# Patient Record
Sex: Male | Born: 1950 | Race: Black or African American | Hispanic: No | Smoking: Current some day smoker
Health system: Southern US, Community
[De-identification: ages and names within clinical notes are randomized; demographics above are authoritative.]

## PROBLEM LIST (undated history)

## (undated) DIAGNOSIS — I639 Cerebral infarction, unspecified: Secondary | ICD-10-CM

## (undated) DIAGNOSIS — K219 Gastro-esophageal reflux disease without esophagitis: Secondary | ICD-10-CM

## (undated) DIAGNOSIS — D179 Benign lipomatous neoplasm, unspecified: Secondary | ICD-10-CM

## (undated) DIAGNOSIS — B182 Chronic viral hepatitis C: Secondary | ICD-10-CM

## (undated) DIAGNOSIS — G4733 Obstructive sleep apnea (adult) (pediatric): Secondary | ICD-10-CM

## (undated) DIAGNOSIS — I10 Essential (primary) hypertension: Secondary | ICD-10-CM

## (undated) DIAGNOSIS — Z973 Presence of spectacles and contact lenses: Secondary | ICD-10-CM

## (undated) HISTORY — DX: Cerebral infarction, unspecified: I63.9

## (undated) HISTORY — DX: Benign lipomatous neoplasm, unspecified: D17.9

## (undated) HISTORY — DX: Gastro-esophageal reflux disease without esophagitis: K21.9

## (undated) HISTORY — PX: FINGER SURGERY: SHX640

## (undated) HISTORY — DX: Obstructive sleep apnea (adult) (pediatric): G47.33

## (undated) HISTORY — DX: Presence of spectacles and contact lenses: Z97.3

## (undated) HISTORY — DX: Chronic viral hepatitis C: B18.2

## (undated) HISTORY — PX: HUMERUS FRACTURE SURGERY: SHX670

---

## 2003-02-26 ENCOUNTER — Emergency Department (HOSPITAL_COMMUNITY): Admission: EM | Admit: 2003-02-26 | Discharge: 2003-02-26 | Payer: Self-pay | Admitting: Emergency Medicine

## 2003-09-20 ENCOUNTER — Emergency Department (HOSPITAL_COMMUNITY): Admission: EM | Admit: 2003-09-20 | Discharge: 2003-09-20 | Payer: Self-pay | Admitting: Emergency Medicine

## 2005-01-16 ENCOUNTER — Emergency Department (HOSPITAL_COMMUNITY): Admission: EM | Admit: 2005-01-16 | Discharge: 2005-01-16 | Payer: Self-pay | Admitting: Family Medicine

## 2005-03-01 ENCOUNTER — Ambulatory Visit: Payer: Self-pay | Admitting: Internal Medicine

## 2005-03-10 ENCOUNTER — Ambulatory Visit: Payer: Self-pay | Admitting: Internal Medicine

## 2005-03-22 ENCOUNTER — Ambulatory Visit: Payer: Self-pay | Admitting: *Deleted

## 2005-03-30 ENCOUNTER — Ambulatory Visit: Payer: Self-pay | Admitting: Internal Medicine

## 2005-08-14 ENCOUNTER — Emergency Department (HOSPITAL_COMMUNITY): Admission: EM | Admit: 2005-08-14 | Discharge: 2005-08-14 | Payer: Self-pay | Admitting: Family Medicine

## 2006-01-03 ENCOUNTER — Emergency Department (HOSPITAL_COMMUNITY): Admission: EM | Admit: 2006-01-03 | Discharge: 2006-01-03 | Payer: Self-pay | Admitting: Family Medicine

## 2006-01-09 ENCOUNTER — Ambulatory Visit: Payer: Self-pay | Admitting: Internal Medicine

## 2006-02-20 ENCOUNTER — Ambulatory Visit: Payer: Self-pay | Admitting: Internal Medicine

## 2006-11-28 ENCOUNTER — Ambulatory Visit: Payer: Self-pay | Admitting: Internal Medicine

## 2006-11-28 ENCOUNTER — Encounter (INDEPENDENT_AMBULATORY_CARE_PROVIDER_SITE_OTHER): Payer: Self-pay | Admitting: Nurse Practitioner

## 2006-11-28 LAB — CONVERTED CEMR LAB
AST: 51 units/L — ABNORMAL HIGH (ref 0–37)
Alkaline Phosphatase: 53 units/L (ref 39–117)
BUN: 12 mg/dL (ref 6–23)
Basophils Relative: 1 % (ref 0–1)
Creatinine, Ser: 1.06 mg/dL (ref 0.40–1.50)
Eosinophils Absolute: 0.1 10*3/uL (ref 0.0–0.7)
Hemoglobin: 14.6 g/dL (ref 13.0–17.0)
MCHC: 33.3 g/dL (ref 30.0–36.0)
MCV: 95 fL (ref 78.0–100.0)
Monocytes Absolute: 0.6 10*3/uL (ref 0.2–0.7)
Monocytes Relative: 11 % (ref 3–11)
RBC: 4.62 M/uL (ref 4.22–5.81)

## 2006-11-29 ENCOUNTER — Encounter (INDEPENDENT_AMBULATORY_CARE_PROVIDER_SITE_OTHER): Payer: Self-pay | Admitting: Nurse Practitioner

## 2006-11-29 LAB — CONVERTED CEMR LAB
HCV Ab: POSITIVE — AB
Hep A Total Ab: POSITIVE — AB
Hep B Core Total Ab: POSITIVE — AB

## 2006-12-05 ENCOUNTER — Ambulatory Visit: Payer: Self-pay | Admitting: Internal Medicine

## 2006-12-19 ENCOUNTER — Encounter (INDEPENDENT_AMBULATORY_CARE_PROVIDER_SITE_OTHER): Payer: Self-pay | Admitting: Nurse Practitioner

## 2006-12-19 ENCOUNTER — Ambulatory Visit: Payer: Self-pay | Admitting: Internal Medicine

## 2006-12-19 LAB — CONVERTED CEMR LAB
HDL: 41 mg/dL (ref >39)
LDL Cholesterol: 52 mg/dL (ref 0–99)

## 2007-01-17 ENCOUNTER — Encounter (INDEPENDENT_AMBULATORY_CARE_PROVIDER_SITE_OTHER): Payer: Self-pay | Admitting: *Deleted

## 2007-02-05 DIAGNOSIS — B1710 Acute hepatitis C without hepatic coma: Secondary | ICD-10-CM | POA: Insufficient documentation

## 2007-02-05 DIAGNOSIS — Z8619 Personal history of other infectious and parasitic diseases: Secondary | ICD-10-CM

## 2007-02-05 DIAGNOSIS — I10 Essential (primary) hypertension: Secondary | ICD-10-CM | POA: Insufficient documentation

## 2007-09-07 ENCOUNTER — Ambulatory Visit: Payer: Self-pay | Admitting: Internal Medicine

## 2007-09-07 LAB — CONVERTED CEMR LAB
CO2: 20 meq/L (ref 19–32)
Calcium: 8.9 mg/dL (ref 8.4–10.5)
Chloride: 107 meq/L (ref 96–112)
Direct LDL: 57 mg/dL
HDL: 37 mg/dL — ABNORMAL LOW (ref >39)
Sodium: 141 meq/L (ref 135–145)
Total CHOL/HDL Ratio: 2.9
Triglycerides: 133 mg/dL (ref <150)

## 2007-09-27 ENCOUNTER — Ambulatory Visit: Payer: Self-pay | Admitting: Internal Medicine

## 2007-10-08 ENCOUNTER — Emergency Department (HOSPITAL_COMMUNITY): Admission: EM | Admit: 2007-10-08 | Discharge: 2007-10-08 | Payer: Self-pay | Admitting: Family Medicine

## 2008-03-26 ENCOUNTER — Ambulatory Visit: Payer: Self-pay | Admitting: Internal Medicine

## 2008-06-04 ENCOUNTER — Ambulatory Visit: Payer: Self-pay | Admitting: Internal Medicine

## 2008-07-16 ENCOUNTER — Ambulatory Visit: Payer: Self-pay | Admitting: Internal Medicine

## 2008-10-04 ENCOUNTER — Emergency Department (HOSPITAL_COMMUNITY): Admission: EM | Admit: 2008-10-04 | Discharge: 2008-10-04 | Payer: Self-pay | Admitting: Emergency Medicine

## 2010-04-30 ENCOUNTER — Encounter (INDEPENDENT_AMBULATORY_CARE_PROVIDER_SITE_OTHER): Payer: Self-pay | Admitting: Internal Medicine

## 2010-04-30 LAB — CONVERTED CEMR LAB
ALT: 97 units/L — ABNORMAL HIGH (ref 0–53)
BUN: 14 mg/dL (ref 6–23)
CO2: 27 meq/L (ref 19–32)
Cholesterol: 136 mg/dL (ref 0–200)
Creatinine, Ser: 1.14 mg/dL (ref 0.40–1.50)
Glucose, Bld: 86 mg/dL (ref 70–99)
HDL: 50 mg/dL (ref >39)
Total Bilirubin: 0.6 mg/dL (ref 0.3–1.2)
Total CHOL/HDL Ratio: 2.7
Triglycerides: 52 mg/dL (ref <150)
VLDL: 10 mg/dL (ref 0–40)

## 2010-07-27 ENCOUNTER — Inpatient Hospital Stay (INDEPENDENT_AMBULATORY_CARE_PROVIDER_SITE_OTHER)
Admission: RE | Admit: 2010-07-27 | Discharge: 2010-07-27 | Disposition: A | Discharge status: Home / Self Care | Payer: Self-pay | Source: Ambulatory Visit | Attending: Family Medicine | Admitting: Family Medicine

## 2010-07-27 DIAGNOSIS — L03019 Cellulitis of unspecified finger: Secondary | ICD-10-CM

## 2013-01-29 ENCOUNTER — Encounter (HOSPITAL_COMMUNITY): Payer: Self-pay | Admitting: Emergency Medicine

## 2013-01-29 ENCOUNTER — Emergency Department (INDEPENDENT_AMBULATORY_CARE_PROVIDER_SITE_OTHER)
Admission: EM | Admit: 2013-01-29 | Discharge: 2013-01-29 | Disposition: A | Discharge status: Home / Self Care | Payer: Self-pay | Source: Home / Self Care

## 2013-01-29 DIAGNOSIS — M542 Cervicalgia: Secondary | ICD-10-CM

## 2013-01-29 DIAGNOSIS — S161XXA Strain of muscle, fascia and tendon at neck level, initial encounter: Secondary | ICD-10-CM

## 2013-01-29 DIAGNOSIS — S139XXA Sprain of joints and ligaments of unspecified parts of neck, initial encounter: Secondary | ICD-10-CM

## 2013-01-29 HISTORY — DX: Essential (primary) hypertension: I10

## 2013-01-29 MED ORDER — DICLOFENAC SODIUM 1 % TD GEL: 1.0000 "application " | Freq: Four times a day (QID) | TRANSDERMAL | Status: DC

## 2013-01-29 MED ORDER — KETOROLAC TROMETHAMINE 30 MG/ML IJ SOLN
INTRAMUSCULAR | Status: AC
  Filled 2013-01-29: qty 1

## 2013-01-29 MED ORDER — KETOROLAC TROMETHAMINE 30 MG/ML IJ SOLN
30.0000 mg | Freq: Once | INTRAMUSCULAR | Status: AC
  Administered 2013-01-29: 30 mg via INTRAMUSCULAR

## 2013-01-29 MED ORDER — HYDROCODONE-ACETAMINOPHEN 5-325 MG PO TABS: 1.0000 | ORAL_TABLET | ORAL | Status: DC | PRN

## 2013-01-29 MED ORDER — CYCLOBENZAPRINE HCL 5 MG PO TABS: 5.0000 mg | ORAL_TABLET | Freq: Three times a day (TID) | ORAL | Status: DC | PRN

## 2013-01-29 NOTE — ED Notes (Signed)
C/o neck pain since Monday and gradually getting worse. Denies injury. States limited ROM, stiffness and discomfort with swallowing. Also having body aches.  Pt has heating pads with no relief in pain.

## 2013-01-29 NOTE — ED Provider Notes (Signed)
Medical screening examination/treatment/procedure(s) were performed by non-physician practitioner and as supervising physician I was immediately available for consultation/collaboration.  Price Lachapelle, M.D.  Lakea Mittelman C Azir Muzyka, MD 01/29/13 2247 

## 2013-01-29 NOTE — ED Provider Notes (Signed)
CSN: [id number]     Arrival date & time 01/29/13  1949 History   None    Chief Complaint  Patient presents with  . Neck Pain    onset monday gradually getting worse. denies injury.    (Consider location/radiation/quality/duration/timing/severity/associated sxs/prior Treatment) HPI Comments: 62 year old male states he awoke yesterday morning with pain and stiffness in the right side of the neck. It has been getting progressively worse over the past 24 hours. He is unaware of any known trauma or injury. He works as a Conservation officer, nature at [employer]. Much of his work includes keeping his head down looking at items in which he must charge for as well as the Ambulance person. At home he spent several hours at a computer on Monday with his head tilted down toward the screen. He states he is currently unable to rotate his head flex or extend his neck. He denies sore throat, earache, drainage or other upper respiratory symptoms.   Past Medical History  Diagnosis Date  . Hypertension    History reviewed. No pertinent past surgical history. History reviewed. No pertinent family history. History  Substance Use Topics  . Smoking status: Current Every Day Smoker -- 0.50 packs/day    Types: Cigarettes  . Smokeless tobacco: Not on file  . Alcohol Use: Yes    Review of Systems  Constitutional: Positive for activity change. Negative for fever, chills, appetite change and fatigue.  HENT: Positive for neck pain and neck stiffness. Negative for hearing loss, ear pain, congestion, sore throat, facial swelling, rhinorrhea, sneezing, mouth sores, postnasal drip and ear discharge.   Respiratory: Negative.   Cardiovascular: Negative.   Gastrointestinal: Negative.   Genitourinary: Negative.   Musculoskeletal:       As per HPI  Skin: Negative.   Neurological: Negative for dizziness, weakness, numbness and headaches.    Allergies  Review of patient's allergies indicates no known allergies.  Home Medications    Current Outpatient Rx  Name  Route  Sig  Dispense  Refill  . cyclobenzaprine (FLEXERIL) 5 MG tablet   Oral   Take 1 tablet (5 mg total) by mouth 3 (three) times daily as needed for muscle spasms.   20 tablet   0   . diclofenac sodium (VOLTAREN) 1 % GEL   Topical   Apply 1 application topically 4 (four) times daily.   100 g   0   . HYDROcodone-acetaminophen (NORCO/VICODIN) 5-325 MG per tablet   Oral   Take 1 tablet by mouth every 4 (four) hours as needed for pain.   15 tablet   0   . Valsartan (DIOVAN PO)   Oral   Take by mouth.          BP 190/110  Pulse 74  Temp(Src) 98.3 F (36.8 C) (Oral)  Resp 20  SpO2 100% Physical Exam  Nursing note and vitals reviewed. Constitutional: He is oriented to person, place, and time. He appears well-developed and well-nourished. No distress.  HENT:  Head: Normocephalic and atraumatic.  Right Ear: External ear normal.  Left Ear: External ear normal.  Mouth/Throat: Oropharynx is clear and moist. No oropharyngeal exudate.  Eyes: EOM are normal. Left eye exhibits no discharge.  Neck:  Rotation to the left and the right is limited to 5. For flexion is limited 5. There is no tenderness to the anterior neck/throat. Minimal tenderness to the left scalene muscles. Marked tenderness to the right lateral neck musculature. No tenderness to the trapezius. He continues to intentionally  shrug his shoulders and maintain that position. No spinal tenderness. No posterior cervical muscular tenderness.  Cardiovascular: Normal rate, regular rhythm and normal heart sounds.   Pulmonary/Chest: Effort normal and breath sounds normal.  Lymphadenopathy:    He has no cervical adenopathy.  Neurological: He is alert and oriented to person, place, and time. No cranial nerve deficit.  Skin: Skin is warm and dry.  Psychiatric: He has a normal mood and affect.    ED Course  Procedures (including critical care time) Labs Review Labs Reviewed - No data to  display Imaging Review No results found.  MDM   1. Cervicalgia   2. Cervical strain, acute, initial encounter    No signs of local infection. Concentrate on not maintaining his shoulders and shrugged position. Apply heat as directed. Soft cervical collar for the next 2-3 days Diclofenac gel applied to the area pain and cover with he such as per my care wraps. Toradol 30 mg IM now Norco 5 mg one by mouth every 4-6 hours when necessary pain Flexeril 5 mg 3 times a day when necessary, may cause drowsiness No work for the next 2 days. Stay away from the computer for the next 3-4 days. May need to adjust computer screen high improved ergonomics. For any symptoms problems or worsening may return or followup with your PCP.  Hayden Rasmussen, NP 01/29/13 2202

## 2013-06-19 ENCOUNTER — Encounter: Payer: Self-pay | Admitting: Medical

## 2013-06-25 ENCOUNTER — Encounter: Payer: Self-pay | Admitting: Medical

## 2013-06-25 ENCOUNTER — Ambulatory Visit (INDEPENDENT_AMBULATORY_CARE_PROVIDER_SITE_OTHER): Payer: 59 | Admitting: Medical

## 2013-06-25 VITALS — BP 170/120 | HR 76 | Temp 97.6°F | Resp 16 | Ht 69.0 in | Wt 168.0 lb

## 2013-06-25 DIAGNOSIS — Z125 Encounter for screening for malignant neoplasm of prostate: Secondary | ICD-10-CM

## 2013-06-25 DIAGNOSIS — Z1211 Encounter for screening for malignant neoplasm of colon: Secondary | ICD-10-CM

## 2013-06-25 DIAGNOSIS — I10 Essential (primary) hypertension: Secondary | ICD-10-CM

## 2013-06-25 DIAGNOSIS — F17200 Nicotine dependence, unspecified, uncomplicated: Secondary | ICD-10-CM

## 2013-06-25 DIAGNOSIS — Z23 Encounter for immunization: Secondary | ICD-10-CM

## 2013-06-25 DIAGNOSIS — K036 Deposits [accretions] on teeth: Secondary | ICD-10-CM

## 2013-06-25 DIAGNOSIS — Z0000 Encounter for general adult medical examination without abnormal findings: Secondary | ICD-10-CM

## 2013-06-25 LAB — CBC
HCT: 44.4 % (ref 39.0–52.0)
Hemoglobin: 15.2 g/dL (ref 13.0–17.0)
MCH: 31.5 pg (ref 26.0–34.0)
MCHC: 34.2 g/dL (ref 30.0–36.0)
MCV: 91.9 fL (ref 78.0–100.0)
PLATELETS: 198 10*3/uL (ref 150–400)
RBC: 4.83 MIL/uL (ref 4.22–5.81)
RDW: 14.1 % (ref 11.5–15.5)
WBC: 4.6 10*3/uL (ref 4.0–10.5)

## 2013-06-25 LAB — COMPREHENSIVE METABOLIC PANEL
ALK PHOS: 53 U/L (ref 39–117)
ALT: 114 U/L — AB (ref 0–53)
AST: 83 U/L — AB (ref 0–37)
Albumin: 4.2 g/dL (ref 3.5–5.2)
BILIRUBIN TOTAL: 0.4 mg/dL (ref 0.2–1.2)
BUN: 9 mg/dL (ref 6–23)
CO2: 29 meq/L (ref 19–32)
CREATININE: 0.95 mg/dL (ref 0.50–1.35)
Calcium: 9.1 mg/dL (ref 8.4–10.5)
Chloride: 102 mEq/L (ref 96–112)
Glucose, Bld: 90 mg/dL (ref 70–99)
Potassium: 3.8 mEq/L (ref 3.5–5.3)
Sodium: 139 mEq/L (ref 135–145)
Total Protein: 8.4 g/dL — ABNORMAL HIGH (ref 6.0–8.3)

## 2013-06-25 LAB — POCT URINALYSIS DIPSTICK
Bilirubin, UA: NEGATIVE
Blood, UA: NEGATIVE
Glucose, UA: NEGATIVE
Ketones, UA: NEGATIVE
LEUKOCYTES UA: NEGATIVE
NITRITE UA: NEGATIVE
PH UA: 6
PROTEIN UA: NEGATIVE
Spec Grav, UA: 1.01
UROBILINOGEN UA: NEGATIVE

## 2013-06-25 LAB — TSH: TSH: 0.915 u[IU]/mL (ref 0.350–4.500)

## 2013-06-25 LAB — LIPID PANEL
CHOL/HDL RATIO: 2.5 ratio
CHOLESTEROL: 121 mg/dL (ref 0–200)
HDL: 48 mg/dL (ref >39)
LDL Cholesterol: 64 mg/dL (ref 0–99)
Triglycerides: 47 mg/dL (ref <150)
VLDL: 9 mg/dL (ref 0–40)

## 2013-06-25 MED ORDER — ASPIRIN EC 81 MG PO TBEC: 81.0000 mg | DELAYED_RELEASE_TABLET | Freq: Every day | ORAL | Status: DC

## 2013-06-25 MED ORDER — TRIAMTERENE-HCTZ 37.5-25 MG PO CAPS: 1.0000 | ORAL_CAPSULE | Freq: Every day | ORAL | Status: DC

## 2013-06-25 MED ORDER — CYCLOBENZAPRINE HCL 5 MG PO TABS: 5.0000 mg | ORAL_TABLET | Freq: Every day | ORAL | Status: DC

## 2013-06-25 MED ORDER — DILTIAZEM HCL ER COATED BEADS 180 MG PO CP24: 180.0000 mg | ORAL_CAPSULE | Freq: Every day | ORAL | Status: DC
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
## 2013-06-25 NOTE — Patient Instructions (Addendum)
Thank you for giving me the opportunity to serve you today.    Your diagnosis today includes: Encounter Diagnoses  Name Primary?  . Routine general medical examination at a health care facility Yes  . Essential hypertension, benign   . Tobacco use disorder   . Prostate cancer screening   . Special screening for malignant neoplasms, colon   . Need for shingles vaccine      Specific recommendations today include:  Let us restart you on blood pressure medication.  Begin both medications once daily.  I don't want you running out of blood pressure medicine as this is dangerous to your health.  Your blood pressure is quite high today  We will call with lab results  We will refer you to a gastroenterologist for your first screening colonoscopy  I recommend you see an eye doctor and dentist yearly  I recommend you have a yearly flu shot  Please check your insurance coverage for shingles vaccine. I do recommend you get this  We will plan on repeating a pneumococcal vaccine at age 56  You need to quit smoking  Follow up: pending labs and in 10-14 days recheck on blood pressure, but return sooner if needed or if problems with the blood pressure medication.   I have included other useful information below for your review.  Hypertension As your heart beats, it forces blood through your arteries. This force is your blood pressure. If the pressure is too high, it is called hypertension (HTN) or high blood pressure. HTN is dangerous because you may have it and not know it. High blood pressure may mean that your heart has to work harder to pump blood. Your arteries may be narrow or stiff. The extra work puts you at risk for heart disease, stroke, and other problems.  Blood pressure consists of two numbers, a higher number over a lower, 110/72, for example. It is stated as "110 over 72." The ideal is below 120 for the top number (systolic) and under 80 for the bottom (diastolic). Write down  your blood pressure today. You should pay close attention to your blood pressure if you have certain conditions such as:  Heart failure.  Prior heart attack.  Diabetes  Chronic kidney disease.  Prior stroke.  Multiple risk factors for heart disease. To see if you have HTN, your blood pressure should be measured while you are seated with your arm held at the level of the heart. It should be measured at least twice. A one-time elevated blood pressure reading (especially in the Emergency Department) does not mean that you need treatment. There may be conditions in which the blood pressure is different between your right and left arms. It is important to see your caregiver soon for a recheck. Most people have essential hypertension which means that there is not a specific cause. This type of high blood pressure may be lowered by changing lifestyle factors such as:  Stress.  Smoking.  Lack of exercise.  Excessive weight.  Drug/tobacco/alcohol use.  Eating less salt. Most people do not have symptoms from high blood pressure until it has caused damage to the body. Effective treatment can often prevent, delay or reduce that damage. TREATMENT  When a cause has been identified, treatment for high blood pressure is directed at the cause. There are a large number of medications to treat HTN. These fall into several categories, and your caregiver will help you select the medicines that are best for you. Medications may have  side effects. You should review side effects with your caregiver. If your blood pressure stays high after you have made lifestyle changes or started on medicines,   Your medication(s) may need to be changed.  Other problems may need to be addressed.  Be certain you understand your prescriptions, and know how and when to take your medicine.  Be sure to follow up with your caregiver within the time frame advised (usually within two weeks) to have your blood pressure  rechecked and to review your medications.  If you are taking more than one medicine to lower your blood pressure, make sure you know how and at what times they should be taken. Taking two medicines at the same time can result in blood pressure that is too low. SEEK IMMEDIATE MEDICAL CARE IF:  You develop a severe headache, blurred or changing vision, or confusion.  You have unusual weakness or numbness, or a faint feeling.  You have severe chest or abdominal pain, vomiting, or breathing problems. MAKE SURE YOU:   Understand these instructions.  Will watch your condition.  Will get help right away if you are not doing well or get worse. Document Released: 04/18/2005 Document Revised: 07/11/2011 Document Reviewed: 12/07/2007 [facility] Patient Information 2014 [place].

## 2013-06-26 ENCOUNTER — Other Ambulatory Visit: Payer: Self-pay | Admitting: Family Medicine

## 2013-06-26 DIAGNOSIS — Z1211 Encounter for screening for malignant neoplasm of colon: Secondary | ICD-10-CM

## 2013-06-26 LAB — PSA: PSA: 0.73 ng/mL (ref <=4.00)

## 2013-06-28 ENCOUNTER — Telehealth: Payer: Self-pay | Admitting: Medical

## 2013-06-28 NOTE — Telephone Encounter (Signed)
LM

## 2013-07-10 ENCOUNTER — Ambulatory Visit (INDEPENDENT_AMBULATORY_CARE_PROVIDER_SITE_OTHER): Payer: 59 | Admitting: Medical

## 2013-07-10 ENCOUNTER — Encounter: Payer: Self-pay | Admitting: Medical

## 2013-07-10 VITALS — BP 122/80 | HR 68 | Temp 97.8°F | Resp 14 | Wt 170.0 lb

## 2013-07-10 DIAGNOSIS — R779 Abnormality of plasma protein, unspecified: Secondary | ICD-10-CM

## 2013-07-10 DIAGNOSIS — R945 Abnormal results of liver function studies: Secondary | ICD-10-CM

## 2013-07-10 DIAGNOSIS — I10 Essential (primary) hypertension: Secondary | ICD-10-CM

## 2013-07-10 DIAGNOSIS — B1920 Unspecified viral hepatitis C without hepatic coma: Secondary | ICD-10-CM

## 2013-07-10 DIAGNOSIS — R7989 Other specified abnormal findings of blood chemistry: Secondary | ICD-10-CM

## 2013-07-10 DIAGNOSIS — E8809 Other disorders of plasma-protein metabolism, not elsewhere classified: Secondary | ICD-10-CM

## 2013-07-10 LAB — GAMMA GT: GGT: 96 U/L — ABNORMAL HIGH (ref 7–51)

## 2013-07-10 NOTE — Progress Notes (Signed)
   Subjective:   Ryan Page is a 63 y.o. male presenting on 07/10/2013 with Follow-up  Here for followup from his recent physical visit and labs regarding abnormal lab tests.  He is compliant with his blood pressure medication which is controlled.  He seems to recall a remote history of positive hepatitis C but no prior treatment. He was advised to have liver biopsy at one point but never went for this. This was through [facility].  No other aggravating or relieving factors.  No other complaint.  Review of Systems ROS as in subjective      Objective:     Filed Vitals:   07/10/13 1547  BP: 122/80  Pulse: 68  Temp: 97.8 F (36.6 C)  Resp: 14    General appearance: alert, no distress, WD/WN Heart: RRR, normal S1, S2, no murmurs Lungs: CTA bilaterally, no wheezes, rhonchi, or rales Abdomen: +bs, soft, non tender, non distended, no masses, no hepatomegaly, no splenomegaly Pulses: 2+ symmetric, upper and lower extremities, normal cap refill Extremities: No edema     Assessment: Encounter Diagnoses  Name Primary?  . Hepatitis C Yes  . Elevated LFTs   . Elevated blood protein   . Essential hypertension, benign      Plan: We reviewed his recent lab tests from his physical showing elevated liver tests and protein. Prior records reviewed showing a diagnosis of hepatitis C which he has never had treatment for.  We discussed prior diagnosis of hep C, complications, preventative measures at home for other household contacts, healthy lifestyle recommendations. Pending labs we will likely update some additional vaccines. We will get a viral load and other hepatitis labs today, we'll send him for ultrasound liver. Followup pending studies  Hypertension-well-controlled current medications  [place] was seen today for follow-up.  Diagnoses and associated orders for this visit:  Hepatitis C - Cancel: Hepatitis C RNA quantitative - Cancel: Hepatitis C  antibody - Hepatitis B core antibody, IgM - Hepatitis B surface antibody - Hepatitis B surface antigen - Gamma GT - AFP tumor marker - US Abdomen Limited RUQ; Future - Hepatitis C antibody, reflex  Elevated LFTs - Cancel: Hepatitis C RNA quantitative - Cancel: Hepatitis C antibody - Hepatitis B core antibody, IgM - Hepatitis B surface antibody - Hepatitis B surface antigen - Gamma GT - AFP tumor marker - US Abdomen Limited RUQ; Future - Hepatitis C antibody, reflex  Elevated blood protein - Hepatitis C antibody, reflex  Essential hypertension, benign - Cancel: Hepatitis C RNA quantitative - Cancel: Hepatitis C antibody - Hepatitis B core antibody, IgM - Hepatitis B surface antibody - Hepatitis B surface antigen - Gamma GT - AFP tumor marker - US Abdomen Limited RUQ; Future    Return pending studies.

## 2013-07-10 NOTE — Patient Instructions (Signed)
Hepatitis C °Hepatitis C is a viral infection of the liver. Infection may go undetected for months or years because symptoms may be absent or very mild. Chronic liver disease is the main danger of hepatitis C. This may lead to scarring of the liver (cirrhosis), liver failure, and liver cancer. °CAUSES  °Hepatitis C is caused by the hepatitis C virus (HCV). Formerly, hepatitis C infections were most commonly transmitted through blood transfusions. In the early 1990s, routine testing of donated blood for hepatitis C and exclusion of blood that tests positive for HCV began. Now, HCV is most commonly transmitted from person to person through injection drug use, sharing needles, or sex with an infected person. A caregiver may also get the infection from exposure to the blood of an infected patient by way of a cut or needle stick.  °SYMPTOMS  °Acute Phase °Many cases of acute HCV infection are mild and cause few problems. Some people may not even realize they are sick. Symptoms in others may last a few weeks to several months and include: °· Feeling very tired. °· Loss of appetite. °· Nausea. °· Vomiting. °· Abdominal pain. °· Dark yellow urine. °· Yellow skin and eyes (jaundice). °· Itching of the skin. °Chronic Phase °· Between 50% to 85% of people who get HCV infection become "chronic carriers." They often have no symptoms, but the virus stays in their body. They may spread the virus to others and can get long-term liver disease. °· Many people with chronic HCV infection remain healthy for many years. However, up to 1 in 5 chronically infected people may develop severe liver diseases including scarring of the liver (cirrhosis), liver failure, or liver cancer. °DIAGNOSIS  °Diagnosis of hepatitis C infection is made by testing blood for the presence of hepatitis C viral particles called RNA. Other tests may also be done to measure the status of current liver function, exclude other liver problems, or assess liver  damage. °TREATMENT  °Treatment with many antiviral drugs is available and recommended for some patients with chronic HCV infection. Drug treatment is generally considered appropriate for patients who: °· Are 18 years of age or older. °· Have a positive test for HCV particles in the blood. °· Have a liver tissue sample (biopsy) that shows chronic hepatitis and significant scarring (fibrosis). °· Do not have signs of liver failure. °· Have acceptable blood test results that confirm the wellness of other body organs. °· Are willing to be treated and conform to treatment requirements. °· Have no other circumstances that would prevent treatment from being recommended (contraindications). °All people who are offered and choose to receive drug treatment must understand that careful medical follow up for many months and even years is crucial in order to make successful care possible. The goal of drug treatment is to eliminate any evidence of HCV in the blood on a long-term basis. This is called a "sustained virologic response" or SVR. Achieving a SVR is associated with a decrease in the chance of life-threatening liver problems, need for a liver transplant, liver cancer rates, and liver-related complications. °Successful treatment currently requires taking treatment drugs for at least 24 weeks and up to 72 weeks. An injected drug (interferon) given weekly and an oral antiviral medicine taken daily are usually prescribed. Side effects from these drugs are common and some may be very serious. Your response to treatment must be carefully monitored by both you and your caregiver throughout the entire treatment period. °PREVENTION °There is no vaccine for hepatitis C. The only   way to prevent the disease is to reduce the risk of exposure to the virus.  °· Avoid sharing drug needles or personal items like toothbrushes, razors, and nail clippers with an infected person. °· Healthcare workers need to avoid injuries and wear  appropriate protective equipment such as gloves, gowns, and face masks when performing invasive medical or nursing procedures. °HOME CARE INSTRUCTIONS  °To avoid making your liver disease worse: °· Strictly avoid drinking alcohol. °· Carefully review all new prescriptions of medicines with your caregiver. Ask your caregiver which drugs you should avoid. The following drugs are toxic to the liver, and your caregiver may tell you to avoid them: °· Isoniazid. °· Methyldopa. °· Acetaminophen. °· Anabolic steroids (muscle-building drugs). °· Erythromycin. °· Oral contraceptives (birth control pills). °· Check with your caregiver to make sure medicine you are currently taking will not be harmful. °· Periodic blood tests may be required. Follow your caregiver's advice about when you should have blood tests. °· Avoid a sexual relationship until advised otherwise by your caregiver. °· Avoid activities that could expose other people to your blood. Examples include sharing a toothbrush, nail clippers, razors, and needles. °· Bed rest is not necessary, but it may make you feel better. Recovery time is not related to the amount of rest you receive. °· This infection is contagious. Follow your caregiver's instructions in order to avoid spread of the infection. °SEEK IMMEDIATE MEDICAL CARE IF: °· You have increasing fatigue or weakness. °· You have an oral temperature above 102° F (38.9° C), not controlled by medicine. °· You develop loss of appetite, nausea, or vomiting. °· You develop jaundice. °· You develop easy bruising or bleeding. °· You develop any severe problems as a result of your treatment. °MAKE SURE YOU:  °· Understand these instructions. °· Will watch your condition. °· Will get help right away if you are not doing well or get worse. °Document Released: 04/15/2000 Document Revised: 07/11/2011 Document Reviewed: 08/18/2010 °ExitCare® Patient Information ©2014 [employer]. ° °

## 2013-07-11 LAB — HEPATITIS B CORE ANTIBODY, IGM: Hep B C IgM: NONREACTIVE

## 2013-07-11 LAB — HEPATITIS B SURFACE ANTIGEN: Hepatitis B Surface Ag: NEGATIVE

## 2013-07-11 LAB — HEPATITIS B SURFACE ANTIBODY, QUANTITATIVE: HEPATITIS B-POST: 0.2 m[IU]/mL

## 2013-07-11 LAB — AFP TUMOR MARKER: AFP-Tumor Marker: 6.3 ng/mL (ref 0.0–8.0)

## 2013-07-11 LAB — HEPATITIS C ANTIBODY, REFLEX: HCV AB: REACTIVE — AB

## 2013-07-12 LAB — HEPATITIS C VRS RNA DETECT BY PCR-QUAL: HEPATITIS C VRS RNA BY PCR-QUAL: POSITIVE — AB

## 2013-07-12 NOTE — Progress Notes (Signed)
lmom to cb. cls 

## 2013-07-16 ENCOUNTER — Ambulatory Visit
Admission: RE | Admit: 2013-07-16 | Discharge: 2013-07-16 | Disposition: A | Discharge status: Home / Self Care | Payer: 59 | Source: Ambulatory Visit | Attending: Medical | Admitting: Medical

## 2013-07-16 DIAGNOSIS — B1920 Unspecified viral hepatitis C without hepatic coma: Secondary | ICD-10-CM

## 2013-07-16 DIAGNOSIS — R945 Abnormal results of liver function studies: Secondary | ICD-10-CM

## 2013-07-16 DIAGNOSIS — R7989 Other specified abnormal findings of blood chemistry: Secondary | ICD-10-CM

## 2013-07-16 DIAGNOSIS — I10 Essential (primary) hypertension: Secondary | ICD-10-CM

## 2013-08-12 ENCOUNTER — Encounter: Payer: Self-pay | Admitting: Medical

## 2013-08-20 ENCOUNTER — Telehealth: Payer: Self-pay | Admitting: Family Medicine

## 2013-08-20 NOTE — Telephone Encounter (Signed)
PATIENT HAS AN APPOINTMENT AT [facility] ON 08/26/13 @ 215 PM AND HE IS AWARE. CLS

## 2014-05-02 DIAGNOSIS — G4733 Obstructive sleep apnea (adult) (pediatric): Secondary | ICD-10-CM

## 2014-05-02 HISTORY — DX: Obstructive sleep apnea (adult) (pediatric): G47.33

## 2014-06-26 ENCOUNTER — Other Ambulatory Visit: Payer: Self-pay | Admitting: Medical

## 2014-06-26 NOTE — Telephone Encounter (Signed)
Ok to RF? 

## 2014-07-06 ENCOUNTER — Other Ambulatory Visit: Payer: Self-pay | Admitting: Medical

## 2014-10-24 ENCOUNTER — Telehealth: Payer: Self-pay | Admitting: Medical

## 2014-10-24 ENCOUNTER — Other Ambulatory Visit: Payer: Self-pay

## 2014-10-24 MED ORDER — TRIAMTERENE-HCTZ 37.5-25 MG PO CAPS: 1.0000 | ORAL_CAPSULE | Freq: Every day | ORAL | Status: DC

## 2014-10-24 NOTE — Telephone Encounter (Signed)
Pt came in and made a med check appt. He is requesting a refill on bp meds until his appt 11/07/2014. Pt uses [facility] on [address].

## 2014-10-24 NOTE — Telephone Encounter (Signed)
Med sent in.

## 2014-11-07 ENCOUNTER — Telehealth: Payer: Self-pay | Admitting: Medical

## 2014-11-07 ENCOUNTER — Other Ambulatory Visit: Payer: Self-pay | Admitting: Family Medicine

## 2014-11-07 ENCOUNTER — Ambulatory Visit (INDEPENDENT_AMBULATORY_CARE_PROVIDER_SITE_OTHER): Payer: 59 | Admitting: Medical

## 2014-11-07 ENCOUNTER — Encounter: Payer: Self-pay | Admitting: Gastroenterology

## 2014-11-07 ENCOUNTER — Encounter: Payer: Self-pay | Admitting: Medical

## 2014-11-07 ENCOUNTER — Other Ambulatory Visit: Payer: Self-pay | Admitting: Medical

## 2014-11-07 VITALS — BP 140/92 | HR 71 | Temp 97.9°F | Resp 14 | Wt 168.0 lb

## 2014-11-07 DIAGNOSIS — I10 Essential (primary) hypertension: Secondary | ICD-10-CM | POA: Diagnosis not present

## 2014-11-07 DIAGNOSIS — Z1211 Encounter for screening for malignant neoplasm of colon: Secondary | ICD-10-CM

## 2014-11-07 DIAGNOSIS — Z720 Tobacco use: Secondary | ICD-10-CM

## 2014-11-07 DIAGNOSIS — B182 Chronic viral hepatitis C: Secondary | ICD-10-CM

## 2014-11-07 DIAGNOSIS — Z23 Encounter for immunization: Secondary | ICD-10-CM

## 2014-11-07 DIAGNOSIS — F17200 Nicotine dependence, unspecified, uncomplicated: Secondary | ICD-10-CM

## 2014-11-07 DIAGNOSIS — Z91199 Patient's noncompliance with other medical treatment and regimen due to unspecified reason: Secondary | ICD-10-CM

## 2014-11-07 DIAGNOSIS — Z9119 Patient's noncompliance with other medical treatment and regimen: Secondary | ICD-10-CM

## 2014-11-07 LAB — BASIC METABOLIC PANEL
BUN: 14 mg/dL (ref 6–23)
CALCIUM: 9.1 mg/dL (ref 8.4–10.5)
CO2: 25 mEq/L (ref 19–32)
CREATININE: 0.99 mg/dL (ref 0.50–1.35)
Chloride: 102 mEq/L (ref 96–112)
Glucose, Bld: 85 mg/dL (ref 70–99)
Potassium: 4.1 mEq/L (ref 3.5–5.3)
Sodium: 138 mEq/L (ref 135–145)

## 2014-11-07 LAB — HEPATIC FUNCTION PANEL
ALBUMIN: 4 g/dL (ref 3.5–5.2)
ALK PHOS: 48 U/L (ref 39–117)
ALT: 89 U/L — AB (ref 0–53)
AST: 81 U/L — AB (ref 0–37)
Bilirubin, Direct: 0.1 mg/dL (ref 0.0–0.3)
Indirect Bilirubin: 0.4 mg/dL (ref 0.2–1.2)
Total Bilirubin: 0.5 mg/dL (ref 0.2–1.2)
Total Protein: 8.5 g/dL — ABNORMAL HIGH (ref 6.0–8.3)

## 2014-11-07 LAB — CBC
HCT: 42.6 % (ref 39.0–52.0)
Hemoglobin: 14.3 g/dL (ref 13.0–17.0)
MCH: 31.8 pg (ref 26.0–34.0)
MCHC: 33.6 g/dL (ref 30.0–36.0)
MCV: 94.9 fL (ref 78.0–100.0)
MPV: 10.6 fL (ref 8.6–12.4)
PLATELETS: 196 10*3/uL (ref 150–400)
RBC: 4.49 MIL/uL (ref 4.22–5.81)
RDW: 14.4 % (ref 11.5–15.5)
WBC: 4.1 10*3/uL (ref 4.0–10.5)

## 2014-11-07 MED ORDER — ASPIRIN EC 81 MG PO TBEC: 81.0000 mg | DELAYED_RELEASE_TABLET | Freq: Every day | ORAL | Status: DC

## 2014-11-07 MED ORDER — AMLODIPINE BESYLATE 10 MG PO TABS: 10.0000 mg | ORAL_TABLET | Freq: Every day | ORAL | Status: DC

## 2014-11-07 MED ORDER — DILTIAZEM HCL ER COATED BEADS 180 MG PO CP24: 180.0000 mg | ORAL_CAPSULE | Freq: Every day | ORAL | Status: DC

## 2014-11-07 MED ORDER — TRIAMTERENE-HCTZ 37.5-25 MG PO CAPS: 1.0000 | ORAL_CAPSULE | Freq: Every day | ORAL | Status: DC

## 2014-11-07 NOTE — Telephone Encounter (Signed)
Will call patient on Monday with Hep C information

## 2014-11-07 NOTE — Telephone Encounter (Signed)
Orders are in EPIC and [facility] will contact the patient for his appointment

## 2014-11-07 NOTE — Telephone Encounter (Signed)
Please call  Patient called re" Rx given today for diltiazem  Wants something cheaper, states this one is $35.00 per month

## 2014-11-07 NOTE — Progress Notes (Signed)
Subjective: Here for recheck.   Had run out of BP medication.   Last visit here over a year ago.    Was compliant with meds until he ran out.   Is walking a lot for exercise.  Active on the job, Freight forwarder at [employer].  Eats relatively healthy, no salt added.  Last eye doctor visit over a year ago.    Hx/o hep C . At last visit we referred to hepatitis clinic, only went once, had a lot of blood work but he can't really clarify what the follow up was.    Hasn't done colonoscopy yet.    Still smokes some days.     No other c/o.   Past Medical History  Diagnosis Date  . Hypertension   . Wears glasses   . Lipoma     upper back    ROS    Objective: BP 140/92 mmHg  Pulse 71  Temp(Src) 97.9 F (36.6 C) (Oral)  Resp 14  Wt 168 lb (76.204 kg)  General appearance: alert, no distress, WD/WN  Neck: supple, no lymphadenopathy, no thyromegaly, no masses, no bruits Heart: RRR, normal S1, S2, no murmurs Lungs: CTA bilaterally, no wheezes, rhonchi, or rales Abdomen: +bs, soft, non tender, non distended, no masses, no hepatomegaly, no splenomegaly Extremities: no edema, no cyanosis, no clubbing Pulses: 2+ symmetric, upper and lower extremities, normal cap refill Neurological: alert, oriented x 3, CN2-12 intact, strength normal upper extremities and lower extremities, sensation normal throughout, DTRs 2+ throughout, no cerebellar signs, gait normal Psychiatric: normal affect, behavior normal, pleasant    Assessment: Encounter Diagnoses  Name Primary?  . Essential hypertension Yes  . Chronic hepatitis C without hepatic coma   . Need for hepatitis B vaccination   . Need for Tdap vaccination   . Noncompliance   . Tobacco use disorder   . Special screening for malignant neoplasms, colon     Plan: HTN - restart BP medications, labs today, f/u pending labs  chronic hep C - will request labs and other records from last hepatitis clinic visit.  He likely is due back for treatment and  other recommendations  Counseled on the Hepatitis B virus vaccine.  Vaccine information sheet given.  Hepatitis B vaccine given after consent obtained.  Patient was advised to return in 2 months for Hep B #2, and in 6 months for Hep B #3.    Counseled on the Tdap (tetanus, diptheria, and acellular pertussis) vaccine.  Vaccine information sheet given. Tdap vaccine given after consent obtained.  Noncompliance - discussed need for better f/u, compliance, risks of uncontrolled hypertension  Tobacco - advised he consider cessation, discussed risks  Referral again for colonoscopy

## 2014-11-07 NOTE — Telephone Encounter (Signed)
GI referral for first colonoscopy

## 2014-11-07 NOTE — Addendum Note (Signed)
Addended by: Christin Bach L on: 11/07/2014 10:59 AM   Modules accepted: Orders

## 2014-11-07 NOTE — Telephone Encounter (Signed)
Patient is aware of the message from [facility] in detail and he understood

## 2014-11-07 NOTE — Telephone Encounter (Signed)
I sent Amlodipine as alternate to Diltiazem.  Needs recheck in 6 wk on this.  Also I did receive notes from hepatitis clinic.  He is due back to them as he did have virus in his system and there were treatment recommendations.  Please ask him to call them back for updated appt, give contact info if needed.

## 2014-11-08 LAB — MICROALBUMIN / CREATININE URINE RATIO
Creatinine, Urine: 212.9 mg/dL
MICROALB UR: 2.4 mg/dL — AB (ref <2.0)
Microalb Creat Ratio: 11.3 mg/g (ref 0.0–30.0)

## 2014-11-10 ENCOUNTER — Other Ambulatory Visit: Payer: Self-pay

## 2014-11-10 ENCOUNTER — Telehealth: Payer: Self-pay

## 2014-11-10 DIAGNOSIS — Z1211 Encounter for screening for malignant neoplasm of colon: Secondary | ICD-10-CM

## 2014-11-10 NOTE — Telephone Encounter (Signed)
Patient came by office to discuss medications. I advised him per Dorothea Ogle to use Amlodipine in place of Diltalizem due to cost.

## 2014-12-02 ENCOUNTER — Encounter: Payer: Self-pay | Admitting: Medical

## 2015-01-01 DIAGNOSIS — I639 Cerebral infarction, unspecified: Secondary | ICD-10-CM

## 2015-01-01 HISTORY — DX: Cerebral infarction, unspecified: I63.9

## 2015-01-09 ENCOUNTER — Ambulatory Visit (AMBULATORY_SURGERY_CENTER): Payer: Self-pay | Admitting: *Deleted

## 2015-01-09 ENCOUNTER — Telehealth: Payer: Self-pay | Admitting: Internal Medicine

## 2015-01-09 VITALS — Ht 69.75 in | Wt 168.4 lb

## 2015-01-09 DIAGNOSIS — Z1211 Encounter for screening for malignant neoplasm of colon: Secondary | ICD-10-CM

## 2015-01-09 NOTE — Telephone Encounter (Signed)
Pt came by and needed referral done for [facility] for his insurance  I have done this referral and faxed it over to [facility]

## 2015-01-09 NOTE — Progress Notes (Signed)
Patient denies any allergies to egg or soy products. Patient denies complications with anesthesia/sedation.  Patient denies oxygen use at home and denies diet medications. Emmi instructions for colonoscopy explained and given to patient.  

## 2015-01-23 ENCOUNTER — Ambulatory Visit (AMBULATORY_SURGERY_CENTER): Payer: 59 | Admitting: Gastroenterology

## 2015-01-23 ENCOUNTER — Encounter: Payer: Self-pay | Admitting: Gastroenterology

## 2015-01-23 VITALS — BP 161/98 | HR 54 | Temp 96.8°F | Resp 17 | Ht 69.75 in | Wt 168.0 lb

## 2015-01-23 DIAGNOSIS — Z1211 Encounter for screening for malignant neoplasm of colon: Secondary | ICD-10-CM | POA: Diagnosis present

## 2015-01-23 MED ORDER — SODIUM CHLORIDE 0.9 % IV SOLN: 500.0000 mL | INTRAVENOUS | Status: DC

## 2015-01-23 NOTE — Patient Instructions (Signed)
YOU HAD AN ENDOSCOPIC PROCEDURE TODAY AT THE [facility]:   Refer to the procedure report that was given to you for any specific questions about what was found during the examination.  If the procedure report does not answer your questions, please call your gastroenterologist to clarify.  If you requested that your care partner not be given the details of your procedure findings, then the procedure report has been included in a sealed envelope for you to review at your convenience later.  YOU SHOULD EXPECT: Some feelings of bloating in the abdomen. Passage of more gas than usual.  Walking can help get rid of the air that was put into your GI tract during the procedure and reduce the bloating. If you had a lower endoscopy (such as a colonoscopy or flexible sigmoidoscopy) you may notice spotting of blood in your stool or on the toilet paper. If you underwent a bowel prep for your procedure, you may not have a normal bowel movement for a few days.  Please Note:  You might notice some irritation and congestion in your nose or some drainage.  This is from the oxygen used during your procedure.  There is no need for concern and it should clear up in a day or so.  SYMPTOMS TO REPORT IMMEDIATELY:   Following lower endoscopy (colonoscopy or flexible sigmoidoscopy):  Excessive amounts of blood in the stool  Significant tenderness or worsening of abdominal pains  Swelling of the abdomen that is new, acute  Fever of 100F or higher   For urgent or emergent issues, a gastroenterologist can be reached at any hour by calling [phone].   DIET: Your first meal following the procedure should be a small meal and then it is ok to progress to your normal diet. Heavy or fried foods are harder to digest and may make you feel nauseous or bloated.  Likewise, meals heavy in dairy and vegetables can increase bloating.  Drink plenty of fluids but you should avoid alcoholic beverages for 24  hours.  ACTIVITY:  You should plan to take it easy for the rest of today and you should NOT DRIVE or use heavy machinery until tomorrow (because of the sedation medicines used during the test).    FOLLOW UP: Our staff will call the number listed on your records the next business day following your procedure to check on you and address any questions or concerns that you may have regarding the information given to you following your procedure. If we do not reach you, we will leave a message.  However, if you are feeling well and you are not experiencing any problems, there is no need to return our call.  We will assume that you have returned to your regular daily activities without incident.  If any biopsies were taken you will be contacted by phone or by letter within the next 1-3 weeks.  Please call us at [phone] if you have not heard about the biopsies in 3 weeks.    SIGNATURES/CONFIDENTIALITY: You and/or your care partner have signed paperwork which will be entered into your electronic medical record.  These signatures attest to the fact that that the information above on your After Visit Summary has been reviewed and is understood.  Full responsibility of the confidentiality of this discharge information lies with you and/or your care-partner. 

## 2015-01-23 NOTE — Progress Notes (Signed)
To recovery, report to Smith, RN, VSS 

## 2015-01-23 NOTE — Op Note (Signed)
[place]  [address] [place], [id number]   COLONOSCOPY PROCEDURE REPORT  PATIENT: Ryan, Page  MR#: [id number] BIRTHDATE: [birth year] , 63  yrs. old GENDER: male ENDOSCOPIST: Milus Banister, MD REFERRED HD:[id number] Tysinger, PA-C PROCEDURE DATE:  01/23/2015 PROCEDURE:   Colonoscopy, screening First Screening Colonoscopy - Avg.  risk and is 50 yrs.  old or older Yes.  Prior Negative Screening - Now for repeat screening. N/A  History of Adenoma - Now for follow-up colonoscopy & has been > or = to 3 yrs.  N/A  Recommend repeat exam, <10 yrs? No ASA CLASS:   Class II INDICATIONS:Screening for colonic neoplasia and Colorectal Neoplasm Risk Assessment for this procedure is average risk. MEDICATIONS: Monitored anesthesia care and Propofol 200 mg IV  DESCRIPTION OF PROCEDURE:   After the risks benefits and alternatives of the procedure were thoroughly explained, informed consent was obtained.  The digital rectal exam revealed no abnormalities of the rectum.   The [id number]  endoscope was introduced through the anus and advanced to the cecum, which was identified by both the appendix and ileocecal valve. No adverse events experienced.   The quality of the prep was excellent.  The instrument was then slowly withdrawn as the colon was fully examined. Estimated blood loss is zero unless otherwise noted in this procedure report.   COLON FINDINGS: A normal appearing cecum, ileocecal valve, and appendiceal orifice were identified.  The ascending, transverse, descending, sigmoid colon, and rectum appeared unremarkable. Retroflexed views revealed no abnormalities. The time to cecum = 2.2 Withdrawal time = 7.2   The scope was withdrawn and the procedure completed. COMPLICATIONS: There were no immediate complications.  ENDOSCOPIC IMPRESSION: Normal colonoscopy No polyps or cancers  RECOMMENDATIONS: You should continue to follow colorectal cancer screening  guidelines for "routine risk" patients with a repeat colonoscopy in 10 years.   eSigned:  Milus Banister, MD 01/23/2015 10:29 AM

## 2015-01-26 ENCOUNTER — Telehealth: Payer: Self-pay

## 2015-01-26 NOTE — Telephone Encounter (Signed)
Left message on answering machine. 

## 2015-01-27 ENCOUNTER — Ambulatory Visit: Payer: 59 | Admitting: Family Medicine

## 2015-01-27 ENCOUNTER — Telehealth: Payer: Self-pay | Admitting: Gastroenterology

## 2015-01-27 ENCOUNTER — Encounter: Payer: Self-pay | Admitting: Family Medicine

## 2015-01-27 ENCOUNTER — Ambulatory Visit (INDEPENDENT_AMBULATORY_CARE_PROVIDER_SITE_OTHER): Payer: 59 | Admitting: Family Medicine

## 2015-01-27 DIAGNOSIS — R4781 Slurred speech: Secondary | ICD-10-CM

## 2015-01-27 DIAGNOSIS — R29898 Other symptoms and signs involving the musculoskeletal system: Secondary | ICD-10-CM

## 2015-01-27 DIAGNOSIS — M6289 Other specified disorders of muscle: Secondary | ICD-10-CM

## 2015-01-27 LAB — BASIC METABOLIC PANEL
BUN: 11 mg/dL (ref 7–25)
CALCIUM: 9.3 mg/dL (ref 8.6–10.3)
CO2: 20 mmol/L (ref 20–31)
CREATININE: 0.92 mg/dL (ref 0.70–1.25)
Chloride: 102 mmol/L (ref 98–110)
GLUCOSE: 87 mg/dL (ref 65–99)
Potassium: 3.6 mmol/L (ref 3.5–5.3)
Sodium: 134 mmol/L — ABNORMAL LOW (ref 135–146)

## 2015-01-27 NOTE — Progress Notes (Signed)
   Subjective:    Patient ID: Ryan Page, male    DOB: April 12, 1951, 64 y.o.   MRN: [id number]  HPI  he had a colonoscopy done on September 23 and noted the next day to have difficulty with slurred speech as well as left hand weakness. No headache, blurred vision, double vision , leg weakness. He also notes slight word searching with his speech. He did complain also of right thigh pain but presently that is not bothering him.   Review of Systems     Objective:   Physical Exam  alert and in no distress. EOMI. Other cranial nerves grossly intact. Slurred speech is noted. Deltoid, biceps and triceps are normal. Grip strength on left is quite weak although he does have good flexion and extension of the wrist. DTRs were difficult to do  extremities.       Assessment & Plan:  Slurred speech - Plan: MR Brain W Contrast, Basic Metabolic Panel  Weakness of left hand - Plan: MR Brain W Contrast, Basic Metabolic Panel

## 2015-01-27 NOTE — Telephone Encounter (Signed)
Spoke to patient and he states that he is having trouble walking and right sided weakness. Patient was advised to contact his PCP or go to the ED as soon as possible. Pt states that he is currently at his PCP office at this time for evaluation.

## 2015-01-28 ENCOUNTER — Ambulatory Visit (HOSPITAL_COMMUNITY)
Admission: RE | Admit: 2015-01-28 | Discharge: 2015-01-28 | Disposition: A | Discharge status: Home / Self Care | Payer: 59 | Source: Ambulatory Visit | Attending: Family Medicine | Admitting: Family Medicine

## 2015-01-28 ENCOUNTER — Other Ambulatory Visit: Payer: Self-pay | Admitting: Family Medicine

## 2015-01-28 ENCOUNTER — Other Ambulatory Visit: Payer: Self-pay | Admitting: *Deleted

## 2015-01-28 ENCOUNTER — Telehealth: Payer: Self-pay | Admitting: Medical

## 2015-01-28 DIAGNOSIS — R29898 Other symptoms and signs involving the musculoskeletal system: Secondary | ICD-10-CM

## 2015-01-28 DIAGNOSIS — R4781 Slurred speech: Secondary | ICD-10-CM

## 2015-01-28 DIAGNOSIS — I739 Peripheral vascular disease, unspecified: Secondary | ICD-10-CM | POA: Insufficient documentation

## 2015-01-28 DIAGNOSIS — I639 Cerebral infarction, unspecified: Secondary | ICD-10-CM

## 2015-01-28 DIAGNOSIS — M6289 Other specified disorders of muscle: Secondary | ICD-10-CM | POA: Insufficient documentation

## 2015-01-28 NOTE — Telephone Encounter (Signed)
Dana @ [facility] called requesting a referral be faxed to her attention at [phone] for the patient's appt tomorrow

## 2015-01-28 NOTE — Telephone Encounter (Signed)
Faxed

## 2015-01-29 ENCOUNTER — Ambulatory Visit (INDEPENDENT_AMBULATORY_CARE_PROVIDER_SITE_OTHER): Payer: 59 | Admitting: Neurology

## 2015-01-29 ENCOUNTER — Encounter: Payer: Self-pay | Admitting: Neurology

## 2015-01-29 ENCOUNTER — Telehealth: Payer: Self-pay | Admitting: Neurology

## 2015-01-29 VITALS — BP 146/88 | HR 80 | Ht 70.0 in | Wt 167.0 lb

## 2015-01-29 DIAGNOSIS — I638 Other cerebral infarction: Secondary | ICD-10-CM | POA: Diagnosis not present

## 2015-01-29 DIAGNOSIS — Z5181 Encounter for therapeutic drug level monitoring: Secondary | ICD-10-CM

## 2015-01-29 DIAGNOSIS — R7401 Elevation of levels of liver transaminase levels: Secondary | ICD-10-CM

## 2015-01-29 DIAGNOSIS — G4719 Other hypersomnia: Secondary | ICD-10-CM

## 2015-01-29 DIAGNOSIS — R740 Nonspecific elevation of levels of transaminase and lactic acid dehydrogenase [LDH]: Secondary | ICD-10-CM | POA: Diagnosis not present

## 2015-01-29 DIAGNOSIS — G8194 Hemiplegia, unspecified affecting left nondominant side: Secondary | ICD-10-CM

## 2015-01-29 DIAGNOSIS — I639 Cerebral infarction, unspecified: Secondary | ICD-10-CM

## 2015-01-29 DIAGNOSIS — G8190 Hemiplegia, unspecified affecting unspecified side: Secondary | ICD-10-CM

## 2015-01-29 DIAGNOSIS — B1920 Unspecified viral hepatitis C without hepatic coma: Secondary | ICD-10-CM

## 2015-01-29 MED ORDER — CLOPIDOGREL BISULFATE 75 MG PO TABS: 75.0000 mg | ORAL_TABLET | Freq: Every day | ORAL | Status: DC

## 2015-01-29 MED ORDER — ATORVASTATIN CALCIUM 40 MG PO TABS: 40.0000 mg | ORAL_TABLET | Freq: Every day | ORAL | Status: DC

## 2015-01-29 NOTE — Telephone Encounter (Signed)
Pt called with a question of / should he take his medicine tomorrow, even though he NPO//call back @ [phone]

## 2015-01-29 NOTE — Telephone Encounter (Signed)
Received call that patient's echocardiogram required preauth. Preauth obtained #[id number] valid until 03/15/2015.
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
## 2015-01-29 NOTE — Telephone Encounter (Signed)
Patient made aware can take medication.

## 2015-01-29 NOTE — Patient Instructions (Signed)
1. Make appt with your primary care provider to discuss your blood pressure.  2. Stop aspirin. Start Plavix 75 mg - 1 tablet daily. Also start Lipitor 40 mg - 1 tablet daily. These prescriptions have been sent to your pharmacy.  3. We have you scheduled for your Carotid Doppler on 01/30/2015 at 9:00 am and your Echocardiogram at 11:00 am. Please arrive 15 minutes prior and go to [facility]. If this is not a good date/time please call [phone] to reschedule.  4. We have you scheduled for a sleep study at [facility] on 04/21/2015 at 8:00 am. They will mail you a packet with all the information you will need. If you need to contact them they can be reached at [phone].  5. You have been referred to [facility] for physical therapy. They will call you directly to schedule an appointment.  Please call [phone] if you do not hear from them.  6. We would like you to have fasting labs completed. Please take lab requisition to [facility] laboratories as soon as possible. In one month, please have additional labs completed. Both requisitions provided.  7. Follow up 6 weeks.

## 2015-01-30 ENCOUNTER — Ambulatory Visit (HOSPITAL_BASED_OUTPATIENT_CLINIC_OR_DEPARTMENT_OTHER)
Admission: RE | Admit: 2015-01-30 | Discharge: 2015-01-30 | Disposition: A | Discharge status: Home / Self Care | Payer: 59 | Source: Ambulatory Visit | Attending: Neurology | Admitting: Neurology

## 2015-01-30 ENCOUNTER — Ambulatory Visit (HOSPITAL_COMMUNITY)
Admission: RE | Admit: 2015-01-30 | Discharge: 2015-01-30 | Disposition: A | Discharge status: Home / Self Care | Payer: 59 | Source: Ambulatory Visit | Attending: Neurology | Admitting: Neurology

## 2015-01-30 DIAGNOSIS — I638 Other cerebral infarction: Secondary | ICD-10-CM

## 2015-01-30 DIAGNOSIS — I639 Cerebral infarction, unspecified: Secondary | ICD-10-CM | POA: Diagnosis present

## 2015-01-30 DIAGNOSIS — I517 Cardiomegaly: Secondary | ICD-10-CM | POA: Diagnosis not present

## 2015-01-30 DIAGNOSIS — F17200 Nicotine dependence, unspecified, uncomplicated: Secondary | ICD-10-CM | POA: Insufficient documentation

## 2015-01-30 DIAGNOSIS — I10 Essential (primary) hypertension: Secondary | ICD-10-CM | POA: Diagnosis not present

## 2015-01-30 NOTE — Progress Notes (Signed)
PRELIMINARY RESULTS BY TECH - Carotid Duplex Completed. No evidence of a significant stenosis noted bilaterally. Bilateral vertebral arteries demonstrate normal antegrade flow.  Oda Cogan, BS, RDMS, RVT

## 2015-01-30 NOTE — Progress Notes (Signed)
Echocardiogram 2D Echocardiogram has been performed.  Tresa Res 01/30/2015, 11:23 AM

## 2015-02-02 ENCOUNTER — Telehealth: Payer: Self-pay | Admitting: Neurology

## 2015-02-02 NOTE — Telephone Encounter (Signed)
Left message on machine for patient to call back. To let him know carotid US and echocardiogram normal. Awaiting call back.

## 2015-02-02 NOTE — Telephone Encounter (Signed)
-----   Message from [place], DO sent at 02/02/2015  7:50 AM EDT ----- You can let pt know that his carotid u/s was normal

## 2015-02-02 NOTE — Telephone Encounter (Signed)
PT returned your call/Dawn CB# [phone]

## 2015-02-02 NOTE — Telephone Encounter (Signed)
Tried to call patient back with no answer.  

## 2015-02-02 NOTE — Telephone Encounter (Signed)
Patient made aware of results.  

## 2015-02-05 ENCOUNTER — Telehealth: Payer: Self-pay | Admitting: Neurology

## 2015-02-05 NOTE — Telephone Encounter (Signed)
-----   Message from [place], DO sent at 02/05/2015  2:06 PM EDT -----  Did he have any of his labs done?  I remember him calling you and saying he was going the following day to have fasting labs ----- Message -----    From: SYSTEM    Sent: 02/03/2015  12:05 AM      To: Eustace Quail Tat, DO

## 2015-02-05 NOTE — Telephone Encounter (Signed)
Spoke with patient. He states he has not gone to have labs drawn yet. Encouraged him to do so. He states he will try to make it there on Friday or Monday.

## 2015-02-06 NOTE — Progress Notes (Signed)
Cheri did you get this? It was in my basket.

## 2015-02-07 LAB — LIPID PANEL
Cholesterol: 82 mg/dL — ABNORMAL LOW (ref 125–200)
HDL: 48 mg/dL (ref >=40)
LDL Cholesterol: 21 mg/dL (ref <130)
Total CHOL/HDL Ratio: 1.7 Ratio (ref <=5.0)
Triglycerides: 66 mg/dL (ref <150)
VLDL: 13 mg/dL (ref <30)

## 2015-02-07 LAB — COMPREHENSIVE METABOLIC PANEL
ALBUMIN: 4 g/dL (ref 3.6–5.1)
ALK PHOS: 56 U/L (ref 40–115)
ALT: 119 U/L — AB (ref 9–46)
AST: 94 U/L — ABNORMAL HIGH (ref 10–35)
BILIRUBIN TOTAL: 0.4 mg/dL (ref 0.2–1.2)
BUN: 11 mg/dL (ref 7–25)
CHLORIDE: 104 mmol/L (ref 98–110)
CO2: 25 mmol/L (ref 20–31)
CREATININE: 1.03 mg/dL (ref 0.70–1.25)
Calcium: 9 mg/dL (ref 8.6–10.3)
Glucose, Bld: 81 mg/dL (ref 65–99)
Potassium: 4.2 mmol/L (ref 3.5–5.3)
SODIUM: 138 mmol/L (ref 135–146)
TOTAL PROTEIN: 8.5 g/dL — AB (ref 6.1–8.1)

## 2015-02-10 ENCOUNTER — Other Ambulatory Visit: Payer: Self-pay | Admitting: Family Medicine

## 2015-02-10 ENCOUNTER — Telehealth: Payer: Self-pay | Admitting: Family Medicine

## 2015-02-10 DIAGNOSIS — Z5181 Encounter for therapeutic drug level monitoring: Secondary | ICD-10-CM

## 2015-02-10 DIAGNOSIS — B1920 Unspecified viral hepatitis C without hepatic coma: Secondary | ICD-10-CM

## 2015-02-10 NOTE — Telephone Encounter (Signed)
Lmovm to rtn my call. 

## 2015-02-10 NOTE — Telephone Encounter (Signed)
Patient was notified of result and advisement. He will have labs re-drawn in a month. I will mail him an order for future labs for [facility]. He is going to call his pcp to get back in with specialist for Hep C.

## 2015-02-10 NOTE — Telephone Encounter (Signed)
VM-PT returned your call/Dawn CB# [phone]

## 2015-02-10 NOTE — Telephone Encounter (Signed)
-----   Message from Cameron Sprang, MD sent at 02/09/2015 12:10 PM EDT ----- Jonelle Sidle, pls let patient know his cholesterol panel looks good, however liver tests show slight increase from previously already elevated liver enzymes. Dr. Carles Collet was checking this because of recently started Lipitor. Pls have him reduce dose to 1/2 tablet daily, and recheck LFTs in a month. Also, pls make sure he is seeing his hepatitis doctor for the liver tests. Thanks

## 2015-02-13 ENCOUNTER — Telehealth: Payer: Self-pay | Admitting: Neurology

## 2015-02-13 NOTE — Telephone Encounter (Signed)
Spoke with patient who was confused after receiving his lab slip in the mail. I advised the blood work he needs done in a month is to repeat his elevated blood work he just had done. He expressed understanding.

## 2015-02-13 NOTE — Telephone Encounter (Signed)
Pt needs to talk to you about blood work  please call [phone]

## 2015-02-17 NOTE — Progress Notes (Signed)
02/17/2015 °

## 2015-02-27 NOTE — Progress Notes (Signed)
Forward to Cheri/ sent to me in error

## 2015-03-03 ENCOUNTER — Encounter: Payer: Self-pay | Admitting: Rehabilitation

## 2015-03-03 ENCOUNTER — Telehealth: Payer: Self-pay | Admitting: Rehabilitation

## 2015-03-03 ENCOUNTER — Ambulatory Visit: Payer: Commercial Managed Care - HMO | Attending: Neurology | Admitting: Rehabilitation

## 2015-03-03 DIAGNOSIS — R29898 Other symptoms and signs involving the musculoskeletal system: Secondary | ICD-10-CM | POA: Diagnosis not present

## 2015-03-03 DIAGNOSIS — I639 Cerebral infarction, unspecified: Secondary | ICD-10-CM

## 2015-03-03 NOTE — Addendum Note (Signed)
Addended byAnnamaria Helling on: 03/03/2015 10:18 AM   Modules accepted: Orders
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
## 2015-03-03 NOTE — Telephone Encounter (Signed)
Order entered for OT/ST

## 2015-03-03 NOTE — Telephone Encounter (Signed)
Dr. Carles Collet,   In addition to last request for OT, could you please write order for SLP evaluation.  Our speech therapist spoke with him for a little bit after my session and felt he needed SLP order.    Thanks,  Cameron Sprang, PT, MPT [facility] [address] [place], [place], [address] Phone: [phone]   Fax:  [phone] 03/03/2015, 9:34 AM

## 2015-03-03 NOTE — Telephone Encounter (Signed)
Dr. Carles Collet,   PT evaluation was completed today 03/03/15. Note no overt signs of LOB, pt independent with gait with great gait speed and FGA score of 25/30.  However, due to limited functional use of L hand, feel patient would benefit from outpatient OT.  If you agree, please submit an order for outpatient OT.  Also note that pt will not be following up here until gets order for OT, therefore please have your office contact him to call [facility] to schedule OT evaluation.    Thanks so much, Cameron Sprang, PT, MPT [facility] [address] [place], [place], [address] Phone: [phone]   Fax:  [phone] 03/03/2015, 8:45 AM

## 2015-03-20 ENCOUNTER — Ambulatory Visit (INDEPENDENT_AMBULATORY_CARE_PROVIDER_SITE_OTHER): Payer: 59 | Admitting: Neurology

## 2015-03-20 ENCOUNTER — Encounter: Payer: Self-pay | Admitting: Neurology

## 2015-03-20 ENCOUNTER — Telehealth: Payer: Self-pay | Admitting: Medical

## 2015-03-20 VITALS — BP 136/90 | HR 76 | Ht 69.0 in | Wt 169.0 lb

## 2015-03-20 DIAGNOSIS — I639 Cerebral infarction, unspecified: Secondary | ICD-10-CM | POA: Diagnosis not present

## 2015-03-20 DIAGNOSIS — B1920 Unspecified viral hepatitis C without hepatic coma: Secondary | ICD-10-CM

## 2015-03-20 NOTE — Telephone Encounter (Signed)
Scheduled for 12/2

## 2015-03-20 NOTE — Telephone Encounter (Signed)
Get him back in soon for f/u on BP

## 2015-03-20 NOTE — Patient Instructions (Signed)
1. We are making a referral to [place]. They will call you directly with an appt. Please call [phone] if you do not hear from them.  2. We have you scheduled for a sleep study at [facility] on 04/21/2015 at 8:00 am. They will mail you a packet with all the information you will need. If you need to contact them they can be reached at [phone].  3. Stop Lipitor (white pill) 4. Stay on Plavix (pink pill) 5. Make a follow up appt with your Primary care provider to discuss better blood pressure control.
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
## 2015-03-23 ENCOUNTER — Telehealth: Payer: Self-pay | Admitting: Neurology

## 2015-03-23 NOTE — Telephone Encounter (Signed)
Referral and records faxed to [facility] at [phone] with confirmation received. They will call patient to schedule appt.

## 2015-04-03 ENCOUNTER — Ambulatory Visit (INDEPENDENT_AMBULATORY_CARE_PROVIDER_SITE_OTHER): Payer: Commercial Managed Care - HMO | Admitting: Medical

## 2015-04-03 ENCOUNTER — Encounter: Payer: Self-pay | Admitting: Medical

## 2015-04-03 VITALS — BP 138/80 | HR 69 | Wt 171.0 lb

## 2015-04-03 DIAGNOSIS — I10 Essential (primary) hypertension: Secondary | ICD-10-CM | POA: Diagnosis not present

## 2015-04-03 DIAGNOSIS — Z720 Tobacco use: Secondary | ICD-10-CM

## 2015-04-03 DIAGNOSIS — I639 Cerebral infarction, unspecified: Secondary | ICD-10-CM | POA: Diagnosis not present

## 2015-04-03 DIAGNOSIS — B182 Chronic viral hepatitis C: Secondary | ICD-10-CM | POA: Diagnosis not present

## 2015-04-03 DIAGNOSIS — R4781 Slurred speech: Secondary | ICD-10-CM | POA: Diagnosis not present

## 2015-04-03 DIAGNOSIS — F17200 Nicotine dependence, unspecified, uncomplicated: Secondary | ICD-10-CM | POA: Insufficient documentation

## 2015-04-03 MED ORDER — LOSARTAN POTASSIUM 25 MG PO TABS: 25.0000 mg | ORAL_TABLET | Freq: Every day | ORAL | Status: DC

## 2015-04-03 NOTE — Patient Instructions (Signed)
   Begin Losartan 25mg  daily in the morning for blood pressure.    continue ALL other medications the same  Eat healthy  STOP smoking  I want to see you back in 4-6 weeks fasting for a physical and labs  Continue with occupational therapy  Go for sleep study  Plan to see the hepatitis clinic  If you haven't heard back on the appointments for sleep study and hepatitis clinic within 2 weeks, then let us know

## 2015-04-03 NOTE — Addendum Note (Signed)
Addended by: Carlena Hurl on: 04/03/2015 10:57 PM   Modules accepted: Orders

## 2015-04-03 NOTE — Progress Notes (Signed)
Subjective: Chief Complaint  Patient presents with  . b/p follow up    said that last week it was 136 for the top but he cant remember the bottom.    Here for f/u on BP.  I last saw him over a year ago, although he has been here for other acute issues.  unfortunately he suffered a stroke back in 01/2015.  He did suffered weakness, defects, speech change.  He has been getting PT and OT, and has made lots of progress.  He is overall doing ok, although this was very scary for him. He saw neurology recently in f/u and they recommend he return to his PCP for further treatment of BP.   He has no other new c/o.  Marland Kitchen Past Medical History  Diagnosis Date  . Hypertension   . Wears glasses   . Lipoma     upper back  . GERD (gastroesophageal reflux disease)     diet controlled, no meds  . Hep C w/o coma, chronic (HCC)    ROS as in subjective  Objective: BP 138/80 mmHg  Pulse 69  Wt 171 lb (77.565 kg)  General appearance: alert, no distress, WD/WN, AA male Oral cavity: MMM, no lesions Neck: supple, no lymphadenopathy, no thyromegaly, no masses Heart: RRR, normal S1, S2, no murmurs Lungs: CTA bilaterally, no wheezes, rhonchi, or rales Abdomen: +bs, soft, non tender, non distended, no masses, no hepatomegaly, no splenomegaly Pulses: 2+ symmetric, upper and lower extremities, normal cap refill Neuro: CN2-12 no obvious weakness.  There is some slurred speech or difficulty formulated worse at time     Assessment: Encounter Diagnoses  Name Primary?  . Essential hypertension Yes  . Cerebrovascular accident (CVA), unspecified mechanism ([facility])   . Current smoker on some days   . Slurred speech   . Chronic hepatitis C without hepatic coma ([place])    Plan: reviewed recent neurology notes and hospital discharge summary.  He is awaiting appointment time from referral from neurology to get back into hepatitis clinic for follow up.   He has ongoing PT/OT and is suppose to be referred by neurology to  speech therapy.  Discussed importance of compliance, diet and exercise.   Strongly recommended he quit tobacco but not ready to quit.  Gave recommendations below.    Patient Instructions   Begin Losartan 25mg  daily in the morning for blood pressure.    continue ALL other medications the same  Eat healthy  STOP smoking  I want to see you back in 4-6 weeks fasting for a physical and labs  Continue with occupational therapy  Go for sleep study  Plan to see the hepatitis clinic  If you haven't heard back on the appointments for sleep study and hepatitis clinic within 2 weeks, then let us know

## 2015-04-21 ENCOUNTER — Ambulatory Visit (HOSPITAL_BASED_OUTPATIENT_CLINIC_OR_DEPARTMENT_OTHER): Payer: Commercial Managed Care - HMO | Attending: Neurology | Admitting: Radiology

## 2015-04-21 VITALS — Ht 70.0 in | Wt 170.0 lb

## 2015-04-21 DIAGNOSIS — G4733 Obstructive sleep apnea (adult) (pediatric): Secondary | ICD-10-CM | POA: Diagnosis not present

## 2015-04-21 DIAGNOSIS — G8194 Hemiplegia, unspecified affecting left nondominant side: Secondary | ICD-10-CM | POA: Diagnosis not present

## 2015-04-21 DIAGNOSIS — G4700 Insomnia, unspecified: Secondary | ICD-10-CM | POA: Diagnosis not present

## 2015-04-21 DIAGNOSIS — I639 Cerebral infarction, unspecified: Secondary | ICD-10-CM | POA: Diagnosis not present

## 2015-04-21 DIAGNOSIS — I10 Essential (primary) hypertension: Secondary | ICD-10-CM | POA: Insufficient documentation

## 2015-04-21 DIAGNOSIS — R0683 Snoring: Secondary | ICD-10-CM | POA: Insufficient documentation

## 2015-04-21 DIAGNOSIS — G4719 Other hypersomnia: Secondary | ICD-10-CM | POA: Insufficient documentation

## 2015-05-03 DIAGNOSIS — G4733 Obstructive sleep apnea (adult) (pediatric): Secondary | ICD-10-CM

## 2015-05-03 NOTE — Progress Notes (Signed)
   NAME: Ryan Page DATE OF BIRTH:  Apr 05, 1951 MEDICAL RECORD NUMBER [id number]  LOCATION: [facility]  PHYSICIAN: Jonel Weldon D  DATE OF STUDY: 04/21/2015 CLINICAL INFORMATION Sleep Study Type: NPSG Indication for sleep study: Excessive Daytime Sleepiness, Hypertension, Snoring Epworth Sleepiness Score: 10  SLEEP STUDY TECHNIQUE As per the AASM Manual for the Scoring of Sleep and Associated Events v2.3 (April 2016) with a hypopnea requiring 4% desaturations. The channels recorded and monitored were frontal, central and occipital EEG, electrooculogram (EOG), submentalis EMG (chin), nasal and oral airflow, thoracic and abdominal wall motion, anterior tibialis EMG, snore microphone, electrocardiogram, and pulse oximetry.  MEDICATIONS Patient's medications include: charted for review Medications self-administered by patient during sleep study : No sleep medicine administered.  SLEEP ARCHITECTURE The study was initiated at 10:45:43 PM and ended at 4:43:55 AM. Sleep onset time was 35.4 minutes and the sleep efficiency was 66.0%. The total sleep time was 236.5 minutes. Stage REM latency was 119.0 minutes. The patient spent 29.39% of the night in stage N1 sleep, 53.07% in stage N2 sleep, 9.30% in stage N3 and 8.25% in REM. Alpha intrusion was absent. Supine sleep was 67.23%. Wake after sleep onset 86 minutes  RESPIRATORY PARAMETERS The overall apnea/hypopnea index (AHI) was 16.0 per hour. There were 38 total apneas, including 35 obstructive, 3 central and 0 mixed apneas. There were 25 hypopneas and 123 RERAs. The AHI during Stage REM sleep was 70.8 per hour. AHI while supine was 15.5 per hour. The mean oxygen saturation was 93.97%. The minimum SpO2 during sleep was 86.00%. Moderate snoring was noted during this study.  CARDIAC DATA The 2 lead EKG demonstrated sinus rhythm. The mean heart rate was 66.90 beats per minute. Other EKG findings  include: None.  LEG MOVEMENT DATA The total PLMS were 2 with a resulting PLMS index of 0.51. Associated arousal with leg movement index was 0.3  . IMPRESSIONS - Moderate obstructive sleep apnea occurred during this study (AHI = 16.0/h). - No significant central sleep apnea occurred during this study (CAI = 0.8/h). - Moderate oxygen desaturation was noted during this study (Min O2 = 86.00%). - The patient snored with Moderate snoring volume. - Very frequent brief awakenings and sleep fragmentation prevented application of split CPAP titration protocol. - No cardiac abnormalities were noted during this study. - Clinically significant periodic limb movements did not occur during sleep. No significant associated arousals.  DIAGNOSIS - Obstructive Sleep Apnea (327.23 [G47.33 ICD-10]) - Insomnia- Difficulty initiating and maintaining sleep  RECOMMENDATIONS - Therapeutic CPAP titration to determine optimal pressure required to alleviate sleep disordered breathing. - Avoid alcohol, sedatives and other CNS depressants that may worsen sleep apnea and disrupt normal sleep architecture. - Sleep hygiene should be reviewed to assess factors that may improve sleep quality. - Weight management and regular exercise should be initiated or continued if appropriate.   Deneise Lever Diplomate, American Board of Sleep Medicine  ELECTRONICALLY SIGNED ON:  05/03/2015, 12:32 PM [place] PH: [phone]   FX: [phone] [place]

## 2015-05-05 ENCOUNTER — Telehealth: Payer: Self-pay | Admitting: Neurology

## 2015-05-05 DIAGNOSIS — G4733 Obstructive sleep apnea (adult) (pediatric): Secondary | ICD-10-CM

## 2015-05-05 NOTE — Telephone Encounter (Signed)
Message from Dr Tat - Please let pt know that he has mod OSAS and CPAP recommended. Is he set up for this?   Left message on machine for patient to call back.

## 2015-05-05 NOTE — Telephone Encounter (Signed)
Patient made aware of results and need for CPAP titration.

## 2015-05-06 NOTE — Telephone Encounter (Signed)
[facility] and confirmed they have CPAP referral and will call patient to schedule.

## 2015-05-08 ENCOUNTER — Ambulatory Visit: Payer: Commercial Managed Care - HMO | Admitting: Occupational Therapy

## 2015-05-08 ENCOUNTER — Ambulatory Visit: Payer: Commercial Managed Care - HMO | Attending: Neurology

## 2015-05-08 DIAGNOSIS — R279 Unspecified lack of coordination: Secondary | ICD-10-CM

## 2015-05-08 DIAGNOSIS — R4189 Other symptoms and signs involving cognitive functions and awareness: Secondary | ICD-10-CM | POA: Insufficient documentation

## 2015-05-08 DIAGNOSIS — R4701 Aphasia: Secondary | ICD-10-CM | POA: Diagnosis not present

## 2015-05-08 DIAGNOSIS — R531 Weakness: Secondary | ICD-10-CM | POA: Diagnosis not present

## 2015-05-08 DIAGNOSIS — I69898 Other sequelae of other cerebrovascular disease: Secondary | ICD-10-CM | POA: Diagnosis not present

## 2015-05-08 DIAGNOSIS — R41841 Cognitive communication deficit: Secondary | ICD-10-CM | POA: Diagnosis not present

## 2015-05-08 DIAGNOSIS — IMO0002 Reserved for concepts with insufficient information to code with codable children: Secondary | ICD-10-CM

## 2015-05-08 DIAGNOSIS — R278 Other lack of coordination: Secondary | ICD-10-CM
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
## 2015-05-15 ENCOUNTER — Encounter: Payer: Self-pay | Admitting: Occupational Therapy

## 2015-05-15 ENCOUNTER — Ambulatory Visit: Payer: Commercial Managed Care - HMO | Admitting: Occupational Therapy

## 2015-05-15 DIAGNOSIS — R41841 Cognitive communication deficit: Secondary | ICD-10-CM | POA: Diagnosis not present

## 2015-05-15 DIAGNOSIS — R4701 Aphasia: Secondary | ICD-10-CM | POA: Diagnosis not present

## 2015-05-15 DIAGNOSIS — I69898 Other sequelae of other cerebrovascular disease: Secondary | ICD-10-CM | POA: Diagnosis not present

## 2015-05-15 DIAGNOSIS — R531 Weakness: Secondary | ICD-10-CM | POA: Diagnosis not present

## 2015-05-15 DIAGNOSIS — R278 Other lack of coordination: Secondary | ICD-10-CM

## 2015-05-15 DIAGNOSIS — R279 Unspecified lack of coordination: Principal | ICD-10-CM

## 2015-05-15 DIAGNOSIS — R4189 Other symptoms and signs involving cognitive functions and awareness: Secondary | ICD-10-CM

## 2015-05-15 DIAGNOSIS — IMO0002 Reserved for concepts with insufficient information to code with codable children: Secondary | ICD-10-CM

## 2015-05-15 NOTE — Patient Instructions (Signed)
  Coordination Activities  Perform the following activities for 5-10 minutes 2-3  times per day with both hand(s).   Rotate ball in fingertips (clockwise and counter-clockwise).  Toss ball between hands.  Toss ball in air and catch with the same hand.  Flip cards 1 at a time as fast as you can.  Deal cards with your thumb (Hold deck in hand and push card off top with thumb).  Rotate card in hand (clockwise and counter-clockwise).  Shuffle cards.  Pick up coins and stack.  Pick up coins one at a time until you get 5-10 in your hand, then move coins from palm to fingertips to stack one at a time.  Twirl pen between fingers.
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
## 2015-05-22 ENCOUNTER — Encounter: Payer: Self-pay | Admitting: Occupational Therapy

## 2015-05-22 ENCOUNTER — Ambulatory Visit: Payer: Commercial Managed Care - HMO

## 2015-05-22 ENCOUNTER — Ambulatory Visit: Payer: Commercial Managed Care - HMO | Admitting: Occupational Therapy

## 2015-05-22 VITALS — BP 177/107 | HR 60

## 2015-05-22 DIAGNOSIS — R4189 Other symptoms and signs involving cognitive functions and awareness: Secondary | ICD-10-CM

## 2015-05-22 DIAGNOSIS — R4701 Aphasia: Secondary | ICD-10-CM | POA: Diagnosis not present

## 2015-05-22 DIAGNOSIS — R279 Unspecified lack of coordination: Secondary | ICD-10-CM

## 2015-05-22 DIAGNOSIS — I69898 Other sequelae of other cerebrovascular disease: Secondary | ICD-10-CM | POA: Diagnosis not present

## 2015-05-22 DIAGNOSIS — R41841 Cognitive communication deficit: Secondary | ICD-10-CM | POA: Diagnosis not present

## 2015-05-22 DIAGNOSIS — R531 Weakness: Secondary | ICD-10-CM | POA: Diagnosis not present

## 2015-05-22 DIAGNOSIS — R278 Other lack of coordination: Secondary | ICD-10-CM
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
## 2015-05-22 NOTE — Patient Instructions (Signed)
Provided pt handout of CVA signs/symptoms

## 2015-05-23 ENCOUNTER — Other Ambulatory Visit: Payer: Self-pay | Admitting: Neurology

## 2015-05-25 NOTE — Telephone Encounter (Signed)
Plavix refill requested. Per last office note- patient to remain on medication. Refill approved and sent to patient's pharmacy.   

## 2015-05-29 ENCOUNTER — Ambulatory Visit: Payer: Commercial Managed Care - HMO

## 2015-05-29 ENCOUNTER — Ambulatory Visit: Payer: Commercial Managed Care - HMO | Admitting: Occupational Therapy

## 2015-05-29 VITALS — BP 158/84

## 2015-05-29 DIAGNOSIS — R279 Unspecified lack of coordination: Principal | ICD-10-CM

## 2015-05-29 DIAGNOSIS — R4701 Aphasia: Secondary | ICD-10-CM

## 2015-05-29 DIAGNOSIS — R278 Other lack of coordination: Secondary | ICD-10-CM

## 2015-05-29 DIAGNOSIS — I69898 Other sequelae of other cerebrovascular disease: Secondary | ICD-10-CM | POA: Diagnosis not present

## 2015-05-29 DIAGNOSIS — R531 Weakness: Secondary | ICD-10-CM | POA: Diagnosis not present

## 2015-05-29 DIAGNOSIS — R4189 Other symptoms and signs involving cognitive functions and awareness: Secondary | ICD-10-CM | POA: Diagnosis not present

## 2015-05-29 DIAGNOSIS — R41841 Cognitive communication deficit: Secondary | ICD-10-CM | POA: Diagnosis not present
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
## 2015-05-29 NOTE — Patient Instructions (Signed)
Keep talking more slowly to give your brain more time to get the right words.
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
## 2015-06-05 ENCOUNTER — Ambulatory Visit: Payer: Commercial Managed Care - HMO | Attending: Neurology

## 2015-06-05 DIAGNOSIS — R4701 Aphasia: Secondary | ICD-10-CM | POA: Diagnosis not present

## 2015-06-05 DIAGNOSIS — R4189 Other symptoms and signs involving cognitive functions and awareness: Secondary | ICD-10-CM | POA: Diagnosis not present

## 2015-06-05 DIAGNOSIS — I69898 Other sequelae of other cerebrovascular disease: Secondary | ICD-10-CM | POA: Diagnosis not present

## 2015-06-05 DIAGNOSIS — R279 Unspecified lack of coordination: Secondary | ICD-10-CM | POA: Insufficient documentation

## 2015-06-05 DIAGNOSIS — R531 Weakness: Secondary | ICD-10-CM | POA: Diagnosis not present

## 2015-06-05 DIAGNOSIS — R29898 Other symptoms and signs involving the musculoskeletal system: Secondary | ICD-10-CM | POA: Diagnosis not present

## 2015-06-05 DIAGNOSIS — R41841 Cognitive communication deficit: Secondary | ICD-10-CM | POA: Diagnosis not present
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
## 2015-06-12 ENCOUNTER — Encounter: Payer: Commercial Managed Care - HMO | Admitting: Occupational Therapy

## 2015-06-12 ENCOUNTER — Ambulatory Visit: Payer: Commercial Managed Care - HMO

## 2015-06-12 DIAGNOSIS — R4189 Other symptoms and signs involving cognitive functions and awareness: Secondary | ICD-10-CM | POA: Diagnosis not present

## 2015-06-12 DIAGNOSIS — R279 Unspecified lack of coordination: Secondary | ICD-10-CM | POA: Diagnosis not present

## 2015-06-12 DIAGNOSIS — R531 Weakness: Secondary | ICD-10-CM | POA: Diagnosis not present

## 2015-06-12 DIAGNOSIS — R4701 Aphasia: Secondary | ICD-10-CM | POA: Diagnosis not present

## 2015-06-12 DIAGNOSIS — R41841 Cognitive communication deficit: Secondary | ICD-10-CM | POA: Diagnosis not present

## 2015-06-12 DIAGNOSIS — I69898 Other sequelae of other cerebrovascular disease: Secondary | ICD-10-CM | POA: Diagnosis not present

## 2015-06-12 DIAGNOSIS — R29898 Other symptoms and signs involving the musculoskeletal system: Secondary | ICD-10-CM | POA: Diagnosis not present
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
## 2015-06-19 ENCOUNTER — Ambulatory Visit: Payer: Commercial Managed Care - HMO | Admitting: Occupational Therapy

## 2015-06-19 VITALS — BP 138/86

## 2015-06-19 DIAGNOSIS — R4189 Other symptoms and signs involving cognitive functions and awareness: Secondary | ICD-10-CM | POA: Diagnosis not present

## 2015-06-19 DIAGNOSIS — R279 Unspecified lack of coordination: Secondary | ICD-10-CM | POA: Diagnosis not present

## 2015-06-19 DIAGNOSIS — IMO0002 Reserved for concepts with insufficient information to code with codable children: Secondary | ICD-10-CM

## 2015-06-19 DIAGNOSIS — R4701 Aphasia: Secondary | ICD-10-CM | POA: Diagnosis not present

## 2015-06-19 DIAGNOSIS — R41841 Cognitive communication deficit: Secondary | ICD-10-CM | POA: Diagnosis not present

## 2015-06-19 DIAGNOSIS — R531 Weakness: Secondary | ICD-10-CM | POA: Diagnosis not present

## 2015-06-19 DIAGNOSIS — R29898 Other symptoms and signs involving the musculoskeletal system: Secondary | ICD-10-CM | POA: Diagnosis not present

## 2015-06-19 DIAGNOSIS — I69898 Other sequelae of other cerebrovascular disease: Secondary | ICD-10-CM | POA: Diagnosis not present

## 2015-06-19 DIAGNOSIS — R278 Other lack of coordination: Secondary | ICD-10-CM
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
## 2015-06-19 NOTE — Patient Instructions (Signed)
1. Grip Strengthening (Resistive Putty)   Squeeze putty using thumb and all fingers. Repeat _20___ times. Do __2__ sessions per day.   2. Roll putty into tube on table and pinch between each finger and thumb x 10 reps each. (can do ring and small finger together)     Copyright  VHI. All rights reserved.   

## 2015-06-26 ENCOUNTER — Ambulatory Visit: Payer: Commercial Managed Care - HMO | Admitting: Occupational Therapy

## 2015-06-26 ENCOUNTER — Ambulatory Visit: Payer: Commercial Managed Care - HMO

## 2015-06-26 DIAGNOSIS — R4701 Aphasia: Secondary | ICD-10-CM | POA: Diagnosis not present

## 2015-06-26 DIAGNOSIS — R29898 Other symptoms and signs involving the musculoskeletal system: Secondary | ICD-10-CM

## 2015-06-26 DIAGNOSIS — R4189 Other symptoms and signs involving cognitive functions and awareness: Secondary | ICD-10-CM

## 2015-06-26 DIAGNOSIS — R279 Unspecified lack of coordination: Secondary | ICD-10-CM

## 2015-06-26 DIAGNOSIS — R531 Weakness: Secondary | ICD-10-CM | POA: Diagnosis not present

## 2015-06-26 DIAGNOSIS — R41841 Cognitive communication deficit: Secondary | ICD-10-CM | POA: Diagnosis not present

## 2015-06-26 DIAGNOSIS — R278 Other lack of coordination: Secondary | ICD-10-CM

## 2015-06-26 DIAGNOSIS — I69898 Other sequelae of other cerebrovascular disease: Secondary | ICD-10-CM | POA: Diagnosis not present
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
## 2015-07-03 ENCOUNTER — Encounter: Payer: Commercial Managed Care - HMO | Admitting: Occupational Therapy

## 2015-07-07 ENCOUNTER — Ambulatory Visit (HOSPITAL_BASED_OUTPATIENT_CLINIC_OR_DEPARTMENT_OTHER): Payer: Commercial Managed Care - HMO | Attending: Neurology

## 2015-07-07 DIAGNOSIS — G4733 Obstructive sleep apnea (adult) (pediatric): Secondary | ICD-10-CM | POA: Diagnosis not present

## 2015-07-07 DIAGNOSIS — G4730 Sleep apnea, unspecified: Secondary | ICD-10-CM | POA: Diagnosis present

## 2015-07-07 DIAGNOSIS — I493 Ventricular premature depolarization: Secondary | ICD-10-CM | POA: Insufficient documentation

## 2015-07-07 DIAGNOSIS — R0683 Snoring: Secondary | ICD-10-CM | POA: Diagnosis not present

## 2015-07-12 DIAGNOSIS — G4733 Obstructive sleep apnea (adult) (pediatric): Secondary | ICD-10-CM | POA: Diagnosis not present

## 2015-07-12 NOTE — Progress Notes (Signed)
  Patient Name: Ryan Page, Ryan Page Date: 07/07/2015 Gender: Male D.O.B: 1951-03-12 Age (years): 24 Referring Provider: Wells Guiles Tat Height (inches): 70 Interpreting Physician: Baird Lyons MD, ABSM Weight (lbs): 170 RPSGT: Madelon Lips BMI: 24 MRN: [id number] Neck Size: 14.50 CLINICAL INFORMATION The patient is referred for a CPAP titration to treat sleep apnea.   Date of NPSG, Split Night or HST:  Diagnostic NPSG 04/21/15   AHI 16/ hr, desaturation to 86%, body weight 170 lbs  SLEEP STUDY TECHNIQUE As per the AASM Manual for the Scoring of Sleep and Associated Events v2.3 (April 2016) with a hypopnea requiring 4% desaturations. The channels recorded and monitored were frontal, central and occipital EEG, electrooculogram (EOG), submentalis EMG (chin), nasal and oral airflow, thoracic and abdominal wall motion, anterior tibialis EMG, snore microphone, electrocardiogram, and pulse oximetry. Continuous positive airway pressure (CPAP) was initiated at the beginning of the study and titrated to treat sleep-disordered breathing.  MEDICATIONS Medications taken by the patient : charted for review Medications administered by patient during sleep study : No sleep medicine administered.  TECHNICIAN COMMENTS Comments added by technician: PT TOLERATED CPAP WITHOUT DIFFICULTY  Comments added by scorer: N/A  RESPIRATORY PARAMETERS Optimal PAP Pressure (cm): 12 AHI at Optimal Pressure (/hr): 0.0 Overall Minimal O2 (%): 91.00 Supine % at Optimal Pressure (%): 100 Minimal O2 at Optimal Pressure (%): 97.0    SLEEP ARCHITECTURE The study was initiated at 10:27:35 PM and ended at 4:36:49 AM. Sleep onset time was 12.9 minutes and the sleep efficiency was 76.7%. The total sleep time was 283.3 minutes. The patient spent 16.69% of the night in stage N1 sleep, 50.30% in stage N2 sleep, 0.00% in stage N3 and 33.00% in REM.Stage REM latency was 62.0 minutes Wake after sleep onset was 73.0. Alpha  intrusion was absent. Supine sleep was 29.52%.  CARDIAC DATA The 2 lead EKG demonstrated sinus rhythm. The mean heart rate was 60.15 beats per minute. Other EKG findings include: PVCs.  LEG MOVEMENT DATA The total Periodic Limb Movements of Sleep (PLMS) were 0. The PLMS index was 0.00. A PLMS index of <15 is considered normal in adults.  IMPRESSIONS - The optimal PAP pressure was 12 cm of water. - Central sleep apnea was not noted during this titration (CAI = 0.0/h). - Significant oxygen desaturations were not observed during this titration (min O2 = 91.00%). - The patient snored with Loud snoring volume during this titration study. - 2-lead EKG demonstrated: PVCs - Clinically significant periodic limb movements were not noted during this study. Arousals associated with PLMs were rare.  DIAGNOSIS - Obstructive Sleep Apnea (327.23 [G47.33 ICD-10])  RECOMMENDATIONS - Trial of CPAP therapy on 12 cm H2O with a Small size Resmed Full Face Mask AirFit F10 mask and heated humidification. - Avoid alcohol, sedatives and other CNS depressants that may worsen sleep apnea and disrupt normal sleep architecture. - Sleep hygiene should be reviewed to assess factors that may improve sleep quality. - Weight management and regular exercise should be initiated or continued.  Deneise Lever Diplomate, American Board of Sleep Medicine  ELECTRONICALLY SIGNED ON:  07/12/2015, 3:21 PM [place] PH: [phone]   FX: [phone] [place]

## 2015-07-14 ENCOUNTER — Telehealth: Payer: Self-pay | Admitting: Neurology

## 2015-07-14 NOTE — Telephone Encounter (Signed)
Spoke with patient. Tried to move appt sooner from May to go over sleep study results. Patient states he will have to look at his work calendar and call back.

## 2015-07-27 ENCOUNTER — Telehealth: Payer: Self-pay | Admitting: Medical

## 2015-07-27 NOTE — Telephone Encounter (Signed)
Ryan Page, [name] with Dr. Kandra Nicolas referral  07/31/15 appt

## 2015-07-27 NOTE — Telephone Encounter (Signed)
done

## 2015-07-31 ENCOUNTER — Encounter: Payer: Self-pay | Admitting: Neurology

## 2015-07-31 ENCOUNTER — Ambulatory Visit (INDEPENDENT_AMBULATORY_CARE_PROVIDER_SITE_OTHER): Payer: Commercial Managed Care - HMO | Admitting: Neurology

## 2015-07-31 VITALS — BP 138/90 | HR 67 | Ht 70.0 in | Wt 172.0 lb

## 2015-07-31 DIAGNOSIS — B182 Chronic viral hepatitis C: Secondary | ICD-10-CM | POA: Diagnosis not present

## 2015-07-31 DIAGNOSIS — G4733 Obstructive sleep apnea (adult) (pediatric): Secondary | ICD-10-CM

## 2015-07-31 DIAGNOSIS — Z8673 Personal history of transient ischemic attack (TIA), and cerebral infarction without residual deficits: Secondary | ICD-10-CM

## 2015-07-31 NOTE — Progress Notes (Signed)
Pt coming in friday April the 7th
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
## 2015-07-31 NOTE — Patient Instructions (Signed)
1. Call [facility] and make an appt. [phone]

## 2015-08-07 ENCOUNTER — Encounter: Payer: Self-pay | Admitting: Medical

## 2015-08-07 ENCOUNTER — Ambulatory Visit (INDEPENDENT_AMBULATORY_CARE_PROVIDER_SITE_OTHER): Payer: Commercial Managed Care - HMO | Admitting: Medical

## 2015-08-07 VITALS — BP 148/90 | HR 66 | Wt 172.0 lb

## 2015-08-07 DIAGNOSIS — G4733 Obstructive sleep apnea (adult) (pediatric): Secondary | ICD-10-CM | POA: Diagnosis not present

## 2015-08-07 DIAGNOSIS — Z720 Tobacco use: Secondary | ICD-10-CM

## 2015-08-07 DIAGNOSIS — R4781 Slurred speech: Secondary | ICD-10-CM

## 2015-08-07 DIAGNOSIS — B182 Chronic viral hepatitis C: Secondary | ICD-10-CM | POA: Diagnosis not present

## 2015-08-07 DIAGNOSIS — I639 Cerebral infarction, unspecified: Secondary | ICD-10-CM

## 2015-08-07 DIAGNOSIS — I10 Essential (primary) hypertension: Secondary | ICD-10-CM

## 2015-08-07 DIAGNOSIS — F17200 Nicotine dependence, unspecified, uncomplicated: Secondary | ICD-10-CM

## 2015-08-07 MED ORDER — LOSARTAN POTASSIUM 50 MG PO TABS: 50.0000 mg | ORAL_TABLET | Freq: Every day | ORAL | Status: DC

## 2015-08-07 MED ORDER — NICOTINE 21 MG/24HR TD PT24: 21.0000 mg | MEDICATED_PATCH | Freq: Every day | TRANSDERMAL | Status: DC

## 2015-08-07 MED ORDER — TRIAMTERENE-HCTZ 37.5-25 MG PO CAPS: 1.0000 | ORAL_CAPSULE | Freq: Every day | ORAL | Status: DC

## 2015-08-07 MED ORDER — AMLODIPINE BESYLATE 10 MG PO TABS: 10.0000 mg | ORAL_TABLET | Freq: Every day | ORAL | Status: DC

## 2015-08-07 MED ORDER — CARTIA XT 180 MG PO CP24: 180.0000 mg | ORAL_CAPSULE | Freq: Every day | ORAL | Status: DC

## 2015-08-07 NOTE — Progress Notes (Signed)
Subjective: Chief Complaint  Patient presents with  . Follow-up    on BP, states it has been running with the top number around 130 but can not remember the bottom number.    Here for f/u on BP.  I last saw him 04/2015.  We added Losartan 25mg  last visit.   He is tolerating this well.   Saw neurology recently, is getting his CPAP today.  He suffered a stroke back in 01/2015.  He did suffer weakness, defects, speech change.  He is overall doing ok.  He has no other new c/o.  Marland Kitchen Past Medical History  Diagnosis Date  . Hypertension   . Wears glasses   . Lipoma     upper back  . GERD (gastroesophageal reflux disease)     diet controlled, no meds  . Hep C w/o coma, chronic (HCC)    ROS as in subjective  Objective: BP 148/90 mmHg  Pulse 66  Wt 172 lb (78.019 kg)  General appearance: alert, no distress, WD/WN, AA male Oral cavity: MMM, no lesions Neck: supple, no lymphadenopathy, no thyromegaly, no masses Heart: RRR, normal S1, S2, no murmurs Lungs: CTA bilaterally, no wheezes, rhonchi, or rales Pulses: 2+ symmetric, upper and lower extremities, normal cap refill Neuro: CN2-12 no obvious weakness.  There is some slurred speech or difficulty formulated worse at time     Assessment: Encounter Diagnoses  Name Primary?  . Essential hypertension Yes  . Slurred speech   . Cerebrovascular accident (CVA), unspecified mechanism ([place])   . Current smoker on some days   . Chronic hepatitis C without hepatic coma ([place])   . OSA (obstructive sleep apnea)      Plan: reviewed recent neurology notes he will pick up CPAP today. He is agreeable to starting nicotine patch today to try and get off tobacco completely.  refilled BP medications today.  increased Losartan to 50mg  daily.  Mouad was seen today for follow-up.  Diagnoses and all orders for this visit:  Essential hypertension  Slurred speech  Cerebrovascular accident (CVA), unspecified mechanism ([place])  Current smoker on some  days  Chronic hepatitis C without hepatic coma ([place])  OSA (obstructive sleep apnea)  Other orders -     nicotine (NICODERM CQ - DOSED IN MG/24 HOURS) 21 mg/24hr patch; Place 1 patch (21 mg total) onto the skin daily. -     losartan (COZAAR) 50 MG tablet; Take 1 tablet (50 mg total) by mouth daily. -     CARTIA XT 180 MG 24 hr capsule; Take 1 capsule (180 mg total) by mouth daily. -     amLODipine (NORVASC) 10 MG tablet; Take 1 tablet (10 mg total) by mouth daily. -     triamterene-hydrochlorothiazide (DYAZIDE) 37.5-25 MG capsule; Take 1 each (1 capsule total) by mouth daily.

## 2015-08-14 DIAGNOSIS — G4733 Obstructive sleep apnea (adult) (pediatric): Secondary | ICD-10-CM | POA: Diagnosis not present

## 2015-08-18 ENCOUNTER — Telehealth: Payer: Self-pay | Admitting: Medical

## 2015-08-18 NOTE — Telephone Encounter (Signed)
I reviewed recent document that came in from [place].  Please have him come in 4-6 wk for now for BP recheck and medicare med check/plus visit (wellness visit).    abstract the eye exam info, vaccine info listed in the [facility] document and keep copy handy for the upcoming visit or get in scanned in

## 2015-08-19 ENCOUNTER — Encounter: Payer: Self-pay | Admitting: Medical

## 2015-08-19 NOTE — Telephone Encounter (Signed)
Couldn't abstract eye exam because pt is not diabetic and no new vaccines where documented.

## 2015-08-19 NOTE — Telephone Encounter (Signed)
Left detailed msg for pt to call and schedule appt

## 2015-09-13 DIAGNOSIS — G4733 Obstructive sleep apnea (adult) (pediatric): Secondary | ICD-10-CM | POA: Diagnosis not present

## 2015-09-18 ENCOUNTER — Ambulatory Visit: Payer: 59 | Admitting: Neurology

## 2015-10-14 DIAGNOSIS — G4733 Obstructive sleep apnea (adult) (pediatric): Secondary | ICD-10-CM | POA: Diagnosis not present

## 2015-11-13 DIAGNOSIS — G4733 Obstructive sleep apnea (adult) (pediatric): Secondary | ICD-10-CM | POA: Diagnosis not present

## 2015-12-10 ENCOUNTER — Encounter: Payer: Self-pay | Admitting: Neurology
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
## 2015-12-11 ENCOUNTER — Encounter: Payer: Self-pay | Admitting: Neurology

## 2015-12-11 ENCOUNTER — Ambulatory Visit (INDEPENDENT_AMBULATORY_CARE_PROVIDER_SITE_OTHER): Payer: Commercial Managed Care - HMO | Admitting: Neurology

## 2015-12-11 VITALS — BP 110/70 | HR 66 | Ht 70.0 in | Wt 168.0 lb

## 2015-12-11 DIAGNOSIS — G4733 Obstructive sleep apnea (adult) (pediatric): Secondary | ICD-10-CM | POA: Diagnosis not present

## 2015-12-11 DIAGNOSIS — B1920 Unspecified viral hepatitis C without hepatic coma: Secondary | ICD-10-CM | POA: Diagnosis not present

## 2015-12-11 MED ORDER — CLOPIDOGREL BISULFATE 75 MG PO TABS: 75.0000 mg | ORAL_TABLET | Freq: Every day | ORAL | 11 refills | Status: AC

## 2015-12-14 DIAGNOSIS — G4733 Obstructive sleep apnea (adult) (pediatric): Secondary | ICD-10-CM | POA: Diagnosis not present

## 2015-12-31 ENCOUNTER — Encounter: Payer: Self-pay | Admitting: Internal Medicine

## 2015-12-31 ENCOUNTER — Telehealth: Payer: Self-pay | Admitting: Internal Medicine

## 2015-12-31 NOTE — Telephone Encounter (Signed)
Pt has an healthfair summary that shane is wanting for pt to come in to go over. This form is in courtney's black bin at her desk. Sent letter for pt to call and schedule appointment to go over summary as he has not returned courtney's call yet

## 2016-01-14 DIAGNOSIS — G4733 Obstructive sleep apnea (adult) (pediatric): Secondary | ICD-10-CM | POA: Diagnosis not present

## 2016-02-05 ENCOUNTER — Ambulatory Visit (INDEPENDENT_AMBULATORY_CARE_PROVIDER_SITE_OTHER): Payer: Commercial Managed Care - HMO | Admitting: Medical

## 2016-02-05 ENCOUNTER — Encounter: Payer: Self-pay | Admitting: Medical

## 2016-02-05 VITALS — BP 152/88 | HR 62 | Ht 70.0 in | Wt 172.0 lb

## 2016-02-05 DIAGNOSIS — F17200 Nicotine dependence, unspecified, uncomplicated: Secondary | ICD-10-CM

## 2016-02-05 DIAGNOSIS — R4781 Slurred speech: Secondary | ICD-10-CM

## 2016-02-05 DIAGNOSIS — B182 Chronic viral hepatitis C: Secondary | ICD-10-CM

## 2016-02-05 DIAGNOSIS — Z23 Encounter for immunization: Secondary | ICD-10-CM | POA: Diagnosis not present

## 2016-02-05 DIAGNOSIS — Z2821 Immunization not carried out because of patient refusal: Secondary | ICD-10-CM | POA: Insufficient documentation

## 2016-02-05 DIAGNOSIS — G4733 Obstructive sleep apnea (adult) (pediatric): Secondary | ICD-10-CM

## 2016-02-05 DIAGNOSIS — N529 Male erectile dysfunction, unspecified: Secondary | ICD-10-CM

## 2016-02-05 DIAGNOSIS — I10 Essential (primary) hypertension: Secondary | ICD-10-CM

## 2016-02-05 DIAGNOSIS — Z8673 Personal history of transient ischemic attack (TIA), and cerebral infarction without residual deficits: Secondary | ICD-10-CM | POA: Diagnosis not present

## 2016-02-05 DIAGNOSIS — Z79899 Other long term (current) drug therapy: Secondary | ICD-10-CM | POA: Diagnosis not present

## 2016-02-05 LAB — CBC WITH DIFFERENTIAL/PLATELET
BASOS ABS: 0 {cells}/uL (ref 0–200)
Basophils Relative: 0 %
Eosinophils Absolute: 42 cells/uL (ref 15–500)
Eosinophils Relative: 1 %
HEMATOCRIT: 42 % (ref 38.5–50.0)
HEMOGLOBIN: 14.4 g/dL (ref 13.2–17.1)
LYMPHS ABS: 1470 {cells}/uL (ref 850–3900)
Lymphocytes Relative: 35 %
MCH: 32.4 pg (ref 27.0–33.0)
MCHC: 34.3 g/dL (ref 32.0–36.0)
MCV: 94.6 fL (ref 80.0–100.0)
MONO ABS: 714 {cells}/uL (ref 200–950)
MPV: 10.2 fL (ref 7.5–12.5)
Monocytes Relative: 17 %
NEUTROS PCT: 47 %
Neutro Abs: 1974 cells/uL (ref 1500–7800)
Platelets: 218 10*3/uL (ref 140–400)
RBC: 4.44 MIL/uL (ref 4.20–5.80)
RDW: 14.5 % (ref 11.0–15.0)
WBC: 4.2 10*3/uL (ref 4.0–10.5)

## 2016-02-05 LAB — LIPID PANEL
CHOL/HDL RATIO: 2.3 ratio (ref <=5.0)
CHOLESTEROL: 107 mg/dL — AB (ref 125–200)
HDL: 46 mg/dL (ref >=40)
LDL Cholesterol: 49 mg/dL (ref <130)
TRIGLYCERIDES: 60 mg/dL (ref <150)
VLDL: 12 mg/dL (ref <30)

## 2016-02-05 LAB — BASIC METABOLIC PANEL
BUN: 16 mg/dL (ref 7–25)
CO2: 28 mmol/L (ref 20–31)
Calcium: 9.2 mg/dL (ref 8.6–10.3)
Chloride: 102 mmol/L (ref 98–110)
Creat: 1.17 mg/dL (ref 0.70–1.25)
Glucose, Bld: 92 mg/dL (ref 65–99)
POTASSIUM: 3.9 mmol/L (ref 3.5–5.3)
SODIUM: 135 mmol/L (ref 135–146)

## 2016-02-05 LAB — HEPATIC FUNCTION PANEL
ALK PHOS: 49 U/L (ref 40–115)
ALT: 94 U/L — ABNORMAL HIGH (ref 9–46)
AST: 70 U/L — AB (ref 10–35)
Albumin: 3.7 g/dL (ref 3.6–5.1)
BILIRUBIN DIRECT: 0.1 mg/dL (ref <=0.2)
BILIRUBIN TOTAL: 0.5 mg/dL (ref 0.2–1.2)
Indirect Bilirubin: 0.4 mg/dL (ref 0.2–1.2)
Total Protein: 8 g/dL (ref 6.1–8.1)

## 2016-02-05 LAB — HEMOGLOBIN A1C
Hgb A1c MFr Bld: 5.4 % (ref <5.7)
MEAN PLASMA GLUCOSE: 108 mg/dL

## 2016-02-05 LAB — TSH: TSH: 0.61 mIU/L (ref 0.40–4.50)

## 2016-02-05 MED ORDER — LOSARTAN POTASSIUM 100 MG PO TABS: 100.0000 mg | ORAL_TABLET | Freq: Every day | ORAL | 3 refills | Status: AC

## 2016-02-05 MED ORDER — AMLODIPINE BESYLATE 10 MG PO TABS: 10.0000 mg | ORAL_TABLET | Freq: Every day | ORAL | 1 refills | Status: AC

## 2016-02-05 MED ORDER — TRIAMTERENE-HCTZ 37.5-25 MG PO CAPS: 1.0000 | ORAL_CAPSULE | Freq: Every day | ORAL | 3 refills | Status: AC

## 2016-02-05 MED ORDER — CARTIA XT 180 MG PO CP24: 180.0000 mg | ORAL_CAPSULE | Freq: Every day | ORAL | 3 refills | Status: AC

## 2016-02-05 NOTE — Patient Instructions (Signed)
Recommendations  High blood pressure  Continue Amlodipine 10mg  daily in the morning  Increase Losartan to 100mg  daily in the morning  Continue Dyazide daily in the morning  continue Cartia XT 180mg  daily in the morning  History of stroke and slurred speech  I recommend an updated MRI  Continue Plavix 75mg  daily  STOP anti-inflammatories such as Ibuprofen, Aleve, Motrin due to risk for blood pressure and kidney issues.   continue your CPAP daily   Chronic hepatitis C  I recommend an updated CT of the abdomen and labs today to screen for liver cancer  I strongly recommend you follow up with hepatology clinic for treatment of Hepatitis C
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
## 2016-02-06 LAB — AFP TUMOR MARKER: AFP-Tumor Marker: 5.7 ng/mL (ref <6.1)

## 2016-02-08 ENCOUNTER — Telehealth: Payer: Self-pay | Admitting: Medical

## 2016-02-08 NOTE — Telephone Encounter (Signed)
See other message as well regarding lab results.  Please call in reference to the [place] done back in April 2017.  I had received a Health Fair assessment on him from a company out of [place], [place].  They have leg ultrasound screen on him as well as an eye scan.   Did someone come out to his house or did he go somewhere for this?  I am trying to get more information on this.    I'm glad he had this done, but see what he says?  Make sure he has seen an eye doctor recently, and if not refer to [facility] if he doesn't have an eye doctor.  There is suspicion of glaucoma.

## 2016-02-09 ENCOUNTER — Encounter: Payer: Self-pay | Admitting: *Deleted

## 2016-02-09 ENCOUNTER — Encounter: Payer: Self-pay | Admitting: Medical

## 2016-02-09 LAB — HEPATITIS C RNA QUANTITATIVE
HCV QUANT LOG: 5.66 {Log} — AB (ref <1.18)
HCV Quantitative: 461847 IU/mL — ABNORMAL HIGH (ref <15)

## 2016-02-09 NOTE — Telephone Encounter (Signed)
Pt's appointment this week, 02/05/16, was to discuss Omega.  Therefore pt was confused as to what I was asking him.  Patient is very hard to understand do to his slurred speech.  Sent pt mychart message with lab results, notified pt on the phone to check his mychart as I would send it for him to review.

## 2016-02-09 NOTE — Telephone Encounter (Signed)
Mail the letter I printed as well Insurance account manager)

## 2016-02-09 NOTE — Telephone Encounter (Signed)
Letter mailed

## 2016-02-12 ENCOUNTER — Ambulatory Visit
Admission: RE | Admit: 2016-02-12 | Discharge: 2016-02-12 | Disposition: A | Discharge status: Home / Self Care | Payer: Commercial Managed Care - HMO | Source: Ambulatory Visit | Attending: Medical | Admitting: Medical

## 2016-02-12 DIAGNOSIS — K7460 Unspecified cirrhosis of liver: Secondary | ICD-10-CM | POA: Diagnosis not present

## 2016-02-12 DIAGNOSIS — B182 Chronic viral hepatitis C: Secondary | ICD-10-CM

## 2016-02-12 MED ORDER — IOPAMIDOL (ISOVUE-300) INJECTION 61%
100.0000 mL | Freq: Once | INTRAVENOUS | Status: AC | PRN
  Administered 2016-02-12: 100 mL via INTRAVENOUS

## 2016-02-13 DIAGNOSIS — G4733 Obstructive sleep apnea (adult) (pediatric): Secondary | ICD-10-CM | POA: Diagnosis not present

## 2016-02-16 ENCOUNTER — Other Ambulatory Visit: Payer: Self-pay

## 2016-02-16 ENCOUNTER — Other Ambulatory Visit: Payer: Self-pay | Admitting: Medical

## 2016-02-16 DIAGNOSIS — K769 Liver disease, unspecified: Secondary | ICD-10-CM

## 2016-02-16 DIAGNOSIS — K7460 Unspecified cirrhosis of liver: Secondary | ICD-10-CM

## 2016-02-16 MED ORDER — PRAVASTATIN SODIUM 40 MG PO TABS: 40.0000 mg | ORAL_TABLET | Freq: Every day | ORAL | 0 refills | Status: AC

## 2016-02-17 ENCOUNTER — Telehealth: Payer: Self-pay

## 2016-02-17 NOTE — Telephone Encounter (Signed)
Spoke to patient. Gave imaging results and med instructions. Patient verbalized understanding. States he does not have an appt scheduled with hepatitis clinic, but will call and schedule. Offered to help with coordinating visit. Patient insisted he would call and set up appt asap.

## 2016-02-17 NOTE — Telephone Encounter (Signed)
-----   Message from Carlena Hurl, PA-C sent at 02/16/2016  5:31 AM EDT ----- Liver CT shows some small indeterminate lesions.  Radiology recommends MRI to further characterize the lesions.  There is also cholesterol plaques/atherosclerosis in the Aorta and right coronary artery.  Lets have him begin Pravachol cholesterol medication QHS for the atherosclerosis.  Lets set him up for MRI liver with IV Eovist contrast assuming he has no metal in his body that he is aware of.  From last visit, make sure he has an appt scheduled with the hepatitis clinic.

## 2016-02-19 DIAGNOSIS — G4733 Obstructive sleep apnea (adult) (pediatric): Secondary | ICD-10-CM | POA: Diagnosis not present

## 2016-03-09 ENCOUNTER — Encounter: Payer: Self-pay | Admitting: Internal Medicine

## 2016-03-15 DIAGNOSIS — G4733 Obstructive sleep apnea (adult) (pediatric): Secondary | ICD-10-CM | POA: Diagnosis not present

## 2016-04-14 DIAGNOSIS — G4733 Obstructive sleep apnea (adult) (pediatric): Secondary | ICD-10-CM | POA: Diagnosis not present

## 2016-05-15 DIAGNOSIS — G4733 Obstructive sleep apnea (adult) (pediatric): Secondary | ICD-10-CM | POA: Diagnosis not present

## 2016-05-21 ENCOUNTER — Encounter (HOSPITAL_COMMUNITY): Payer: Self-pay | Admitting: Emergency Medicine

## 2016-05-21 ENCOUNTER — Emergency Department (HOSPITAL_COMMUNITY): Payer: Commercial Managed Care - HMO

## 2016-05-21 ENCOUNTER — Encounter (HOSPITAL_COMMUNITY): Admission: EM | Disposition: E | Payer: Self-pay | Source: Home / Self Care | Attending: Emergency Medicine

## 2016-05-21 ENCOUNTER — Emergency Department (HOSPITAL_COMMUNITY)
Admission: EM | Admit: 2016-05-21 | Discharge: 2016-06-02 | Disposition: E | Payer: Commercial Managed Care - HMO | Attending: Emergency Medicine | Admitting: Emergency Medicine

## 2016-05-21 DIAGNOSIS — Z79899 Other long term (current) drug therapy: Secondary | ICD-10-CM | POA: Insufficient documentation

## 2016-05-21 DIAGNOSIS — I469 Cardiac arrest, cause unspecified: Secondary | ICD-10-CM | POA: Insufficient documentation

## 2016-05-21 DIAGNOSIS — R402431 Glasgow coma scale score 3-8, in the field [EMT or ambulance]: Secondary | ICD-10-CM | POA: Diagnosis not present

## 2016-05-21 DIAGNOSIS — I219 Acute myocardial infarction, unspecified: Secondary | ICD-10-CM | POA: Diagnosis not present

## 2016-05-21 DIAGNOSIS — I10 Essential (primary) hypertension: Secondary | ICD-10-CM | POA: Insufficient documentation

## 2016-05-21 DIAGNOSIS — F17210 Nicotine dependence, cigarettes, uncomplicated: Secondary | ICD-10-CM | POA: Insufficient documentation

## 2016-05-21 DIAGNOSIS — R570 Cardiogenic shock: Secondary | ICD-10-CM | POA: Diagnosis not present

## 2016-05-21 DIAGNOSIS — Z5181 Encounter for therapeutic drug level monitoring: Secondary | ICD-10-CM | POA: Diagnosis not present

## 2016-05-21 DIAGNOSIS — Z8673 Personal history of transient ischemic attack (TIA), and cerebral infarction without residual deficits: Secondary | ICD-10-CM | POA: Insufficient documentation

## 2016-05-21 DIAGNOSIS — Z4682 Encounter for fitting and adjustment of non-vascular catheter: Secondary | ICD-10-CM | POA: Diagnosis not present

## 2016-05-21 LAB — COMPREHENSIVE METABOLIC PANEL
ALT: 146 U/L — AB (ref 17–63)
ANION GAP: 15 (ref 5–15)
AST: 158 U/L — ABNORMAL HIGH (ref 15–41)
Albumin: 2.5 g/dL — ABNORMAL LOW (ref 3.5–5.0)
Alkaline Phosphatase: 56 U/L (ref 38–126)
BUN: 12 mg/dL (ref 6–20)
CHLORIDE: 104 mmol/L (ref 101–111)
CO2: 11 mmol/L — AB (ref 22–32)
CREATININE: 1.36 mg/dL — AB (ref 0.61–1.24)
Calcium: 7 mg/dL — ABNORMAL LOW (ref 8.9–10.3)
GFR, EST NON AFRICAN AMERICAN: 53 mL/min — AB (ref >60)
Glucose, Bld: 278 mg/dL — ABNORMAL HIGH (ref 65–99)
POTASSIUM: 2.8 mmol/L — AB (ref 3.5–5.1)
SODIUM: 130 mmol/L — AB (ref 135–145)
Total Bilirubin: 0.2 mg/dL — ABNORMAL LOW (ref 0.3–1.2)
Total Protein: 5.2 g/dL — ABNORMAL LOW (ref 6.5–8.1)

## 2016-05-21 LAB — CBG MONITORING, ED: Glucose-Capillary: 258 mg/dL — ABNORMAL HIGH (ref 65–99)

## 2016-05-21 LAB — PROTIME-INR
INR: 1.52
Prothrombin Time: 18.5 seconds — ABNORMAL HIGH (ref 11.4–15.2)

## 2016-05-21 LAB — I-STAT VENOUS BLOOD GAS, ED
Acid-base deficit: 22 mmol/L — ABNORMAL HIGH (ref 0.0–2.0)
BICARBONATE: 13.1 mmol/L — AB (ref 20.0–28.0)
O2 Saturation: 95 %
PH VEN: 6.85 — AB (ref 7.250–7.430)
PO2 VEN: 138 mmHg — AB (ref 32.0–45.0)
TCO2: 15 mmol/L (ref 0–100)
pCO2, Ven: 75.3 mmHg (ref 44.0–60.0)

## 2016-05-21 LAB — DIFFERENTIAL
BASOS ABS: 0 10*3/uL (ref 0.0–0.1)
BASOS PCT: 0 %
EOS ABS: 0 10*3/uL (ref 0.0–0.7)
Eosinophils Relative: 0 %
LYMPHS PCT: 48 %
Lymphs Abs: 3.2 10*3/uL (ref 0.7–4.0)
Monocytes Absolute: 0.4 10*3/uL (ref 0.1–1.0)
Monocytes Relative: 6 %
Neutro Abs: 3.1 10*3/uL (ref 1.7–7.7)
Neutrophils Relative %: 46 %

## 2016-05-21 LAB — LIPID PANEL
CHOL/HDL RATIO: 2.3 ratio
CHOLESTEROL: 72 mg/dL (ref 0–200)
HDL: 31 mg/dL — AB (ref >40)
LDL Cholesterol: 19 mg/dL (ref 0–99)
TRIGLYCERIDES: 110 mg/dL (ref <150)
VLDL: 22 mg/dL (ref 0–40)

## 2016-05-21 LAB — CBC
HEMATOCRIT: 40.8 % (ref 39.0–52.0)
Hemoglobin: 13.1 g/dL (ref 13.0–17.0)
MCH: 31.3 pg (ref 26.0–34.0)
MCHC: 32.1 g/dL (ref 30.0–36.0)
MCV: 97.6 fL (ref 78.0–100.0)
PLATELETS: 156 10*3/uL (ref 150–400)
RBC: 4.18 MIL/uL — AB (ref 4.22–5.81)
RDW: 14 % (ref 11.5–15.5)
WBC: 6.7 10*3/uL (ref 4.0–10.5)

## 2016-05-21 LAB — I-STAT CHEM 8, ED
BUN: 15 mg/dL (ref 6–20)
CREATININE: 1.2 mg/dL (ref 0.61–1.24)
Calcium, Ion: 0.93 mmol/L — ABNORMAL LOW (ref 1.15–1.40)
Chloride: 106 mmol/L (ref 101–111)
GLUCOSE: 272 mg/dL — AB (ref 65–99)
HCT: 41 % (ref 39.0–52.0)
HEMOGLOBIN: 13.9 g/dL (ref 13.0–17.0)
Potassium: 2.9 mmol/L — ABNORMAL LOW (ref 3.5–5.1)
Sodium: 139 mmol/L (ref 135–145)
TCO2: 16 mmol/L (ref 0–100)

## 2016-05-21 LAB — I-STAT TROPONIN, ED: Troponin i, poc: 0.86 ng/mL (ref 0.00–0.08)

## 2016-05-21 LAB — I-STAT CG4 LACTIC ACID, ED: Lactic Acid, Venous: 10.55 mmol/L (ref 0.5–1.9)

## 2016-05-21 LAB — TROPONIN I: TROPONIN I: 0.45 ng/mL — AB (ref <0.03)

## 2016-05-21 LAB — I-STAT CREATININE, ED: CREATININE: 1.2 mg/dL (ref 0.61–1.24)

## 2016-05-21 LAB — APTT: APTT: 53 s — AB (ref 24–36)

## 2016-05-21 SURGERY — LEFT HEART CATH AND CORONARY ANGIOGRAPHY
Anesthesia: LOCAL

## 2016-05-21 MED ORDER — FENTANYL CITRATE (PF) 100 MCG/2ML IJ SOLN
INTRAMUSCULAR | Status: AC | PRN
  Administered 2016-05-21: 50 ug via INTRAVENOUS

## 2016-05-21 MED ORDER — ROCURONIUM BROMIDE 50 MG/5ML IV SOLN
INTRAVENOUS | Status: DC | PRN
  Administered 2016-05-21: 100 mg via INTRAVENOUS

## 2016-05-21 MED ORDER — LACTATED RINGERS IV SOLN
INTRAVENOUS | Status: DC | PRN
  Administered 2016-05-21: 2000 mL via INTRAVENOUS
  Administered 2016-05-21: 1000 mL/h via INTRAVENOUS

## 2016-05-21 MED ORDER — CALCIUM CHLORIDE 10 % IV SOLN
INTRAVENOUS | Status: DC | PRN
Start: 2016-05-21 — End: 2016-05-22
  Administered 2016-05-21: 1 g via INTRAVENOUS

## 2016-05-21 MED ORDER — EPINEPHRINE PF 1 MG/ML IJ SOLN
0.5000 ug/min | INTRAVENOUS | Status: DC
  Administered 2016-05-21: 10 ug/min via INTRAVENOUS
  Filled 2016-05-21: qty 4

## 2016-05-21 MED ORDER — FENTANYL CITRATE (PF) 100 MCG/2ML IJ SOLN
INTRAMUSCULAR | Status: AC
  Filled 2016-05-21: qty 2

## 2016-05-21 MED ORDER — DEXTROSE 5 % IV SOLN
0.0000 ug/min | Freq: Once | INTRAVENOUS | Status: AC
  Administered 2016-05-21: 4 ug/min via INTRAVENOUS
  Filled 2016-05-21: qty 4

## 2016-05-21 MED ORDER — EPINEPHRINE PF 1 MG/10ML IJ SOSY
PREFILLED_SYRINGE | INTRAMUSCULAR | Status: DC | PRN
  Administered 2016-05-21: 1 mg via INTRAVENOUS
  Administered 2016-05-21: 1 via INTRAVENOUS

## 2016-05-21 MED ORDER — SODIUM CHLORIDE 0.9 % IV SOLN
INTRAVENOUS | Status: AC | PRN
  Administered 2016-05-21: 2000 mL via INTRAVENOUS

## 2016-05-21 MED ORDER — ETOMIDATE 2 MG/ML IV SOLN
INTRAVENOUS | Status: DC | PRN
  Administered 2016-05-21: 10 mg via INTRAVENOUS

## 2016-05-21 MED ORDER — EPINEPHRINE PF 1 MG/10ML IJ SOSY
PREFILLED_SYRINGE | INTRAMUSCULAR | Status: AC | PRN
  Administered 2016-05-21: 1 via INTRAVENOUS

## 2016-05-21 MED ORDER — SODIUM BICARBONATE 8.4 % IV SOLN
INTRAVENOUS | Status: DC | PRN
  Administered 2016-05-21: 100 meq via INTRAVENOUS

## 2016-06-02 NOTE — ED Notes (Signed)
Per DR. Zavitz, no code cool or plans for cath lab at this time. MD aware of pt temp, no new orders.

## 2016-06-02 NOTE — ED Notes (Signed)
[facility] contacted to activate Code Stemi 

## 2016-06-02 NOTE — Plan of Care (Signed)
PCCM PLAN OF CARE  Called to ED to admit patient after cardiac arrest with prolonged CPR time (>1hr).  Per Cardiology, patient suffered an inferior STEMI but was too unstable for cath lab.  Patient sufferred asystolic arrest x3 in ED trauma bay.  I spoke with family several times who instructed [place] to continue full code.  Exam: Gen: + acute distress Neuro: + myoclonic jerks noted Cardiac: distant heart sounds, brady cardic Lungs: bilateral rales  Bedside TTE: - No pericardial effusion - Global hypokinesis with underfilled RV - RV profoundly hypokinetic and small size - no paradoxical septal motion - IVC not collapsible  - Bilateral lung sliding on pleural exam  A/P: Central line was inserted while bicarbonate pushes, norepi and epi gtt's were running with intermittent pushes of epinephrine needed.  We externally paced the patient with minimal response in HR and BP despite pacer spikes indicating capture.  Calcium was pushed IV,  No pneumothorax or pericardial effusion was noted.  The patient remained in profound cardiogenic shock.  After discussion with cardiology, nursing, PCCM and ED staff, no reversible causes could be identified.  Massive PE was considered but based on bedside TTE this seemed far less likely.  The multidisciplinary decision was made not to continue resuscitative efforts given the medical futility.  All members of the team were in agreement.  Ultimately the patient demonstrated no cardiac activity on follow up bedside Echo and time of death was called.  The family was notified and I explained that this was most likely cardiogenic shock from myocardial infarction.   Meribeth Mattes, DO., MS [facility]

## 2016-06-02 NOTE — Code Documentation (Signed)
CPR started.

## 2016-06-02 NOTE — Progress Notes (Signed)
RT assisted with CPR with bagging patient. MD placed central line and RT provided pressure bag to monitor CVP. RT attempted radial arterial line x 2 without success.

## 2016-06-02 NOTE — Procedures (Signed)
CENTRAL VENOUS CATHETER INSERTION   Indication: Shock Consent: Verbally given by Daughter Time out: yes Appropriate position: yes Hand washing: yes Patient Sterilized and Draped: yes Location: Right IJ # of Attempts: 1 Ultrasound Guidance: yes Wire Confirmed with [place]: yes Insertion depth: 20 cm All Ports Draw and flush: yes CXR:   Pneumothorax: no Line sutured in place: yes EBL: 10 cc Complications: no Patient Tolerated Procedure Well: Line was placed in the midst of a code.  Patient did not survive     Meribeth Mattes, DO., MS [facility]

## 2016-06-02 NOTE — ED Notes (Signed)
Cardiology, chaplain and family member at the bedside.

## 2016-06-02 NOTE — ED Notes (Signed)
Pacing started with a HR of 60.

## 2016-06-02 NOTE — ED Notes (Signed)
Pulses checked, still present.

## 2016-06-02 NOTE — ED Notes (Signed)
CRITICAL VALUE ALERT  Critical value received:  Troponin 0.45

## 2016-06-02 NOTE — Code Documentation (Signed)
Pulse back

## 2016-06-02 NOTE — Consult Note (Signed)
   CARDIOLOGY CONSULT  HPI:  Ryan Page is a 66 y.o. old male who presents via EMS with inferior STEMI.  Per EMS, the patient was found down at home by his wife who returned home from shopping.  She called 911 and on arrival EMS started CPR.  This was continued for 45 minutes and he was transported to the ED.  On presentation to the ED, chest compressions were ongoing.  ECG demonstrated ST elevation in the inferior leads.  Just after intubation, there was ROSC.  Assessment/Plan Inferior STEMI   Assessment:  Patient presents after being found down for unknown period of time with prolonged CPR.  Exam with fixed pupils without sedation.  Agonal breathing over the vent.  Lung exam with profound pulmonary edema.  Bedside ECHO with akinetic septum and in limited views minimal RV movement.  Currently in accelerated junctional rhythm.  After long discussion with interventional attending on call, elected to not take emergently to the cath lab given likely neurologic insult.     Plan  -  Conservative measures  -  Suggest comfort care discussion with the family       Intake/Output Summary (Last 24 hours) at May 22, 2016 1933 Last data filed at 2016-05-22 1904  Gross per 24 hour  Intake             2000 ml  Output               30 ml  Net             1970 ml    MEDS:  sodium chloride Last Rate: Stopped (05/22/16 1901)    fentaNYL      Review of Systems:  Unable to obtain  Physical Examination: Blood pressure (!) 84/60, pulse (!) 52, temperature (!) 94.5 F (34.7 C), resp. rate 13, height 5\' 9"  (1.753 m), weight 77.1 kg (170 lb), SpO2 93 %. General:  Cool to the touch, agonal breathing, intubated HENT: Normocephalic. Atraumatic.  No acute abnom. EYES: Pupils fixed, no extraoccular movement  Neck: Supple.  No JVD.  No bruits. Cardiovascular:  Bradycardic, 2/6 systolic murmur  Pulmonary/Chest: rales thoughout the chest  Abdomen: Soft, NT, no masses, no organomegaly. Neuro: no spontaneous  movements in the extremities,  Pupils fixed Ext: cool to the touch, weak distal pulses, IO access in the right lower leg SKIN- intact  Recent Labs     May 22, 2016  1824  2016/05/22  1855  May 22, 2016  1856  HGB  13.1  13.9   --   HCT  40.8  41.0   --   WBC  6.7   --    --   BUN   --   15   --   CREATININE   --   1.20  1.20  GLUCOSE   --   272*   --   INR  1.52   --    --     Discuss the benefits and adverse side affects of the medications use.  Discuss the benefits and adverse side affects of the required study.  Discuss the risk and benefits of ambulation during hospitalization.   Baruch Merl, MD, PhD Cardiology

## 2016-06-02 NOTE — Code Documentation (Signed)
Low pulse and weak present at this time.

## 2016-06-02 NOTE — Progress Notes (Signed)
   Followed-up with family  Escorted to bedside.

## 2016-06-02 NOTE — ED Notes (Signed)
Pulse check, weak pulses at this time.
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
## 2016-06-02 NOTE — ED Notes (Signed)
Heart US performed by Dr. Sharen Heck at bedside , no cardiac activity, Death pronounced at Aug 14, 2054.

## 2016-06-02 NOTE — Code Documentation (Signed)
Pulse check  Epi calcium in.

## 2016-06-02 NOTE — Code Documentation (Signed)
Per [facility], pt wife went shopping came back at 1730, pt was normal prior to leaving, found pt on the floor not breathing, Fire arrived started compressions, pt given 1 shock, had brief ROSC, with 9 epis given total. Pt arrived CPR in progress while in PEA. King airway in place. Unwitnessed arrest. Hx of HTN and strokes.

## 2016-06-02 NOTE — Progress Notes (Signed)
   Met w/ family in ED consult room B.  Page on-call chaplain, as needed.  - Rev. [place] MDiv ThM

## 2016-06-02 DEATH — deceased

## 2016-12-12 ENCOUNTER — Ambulatory Visit: Payer: Commercial Managed Care - HMO | Admitting: Neurology

## 2017-04-08 IMAGING — MR MR HEAD W/O CM
10 of 12 series · 32 of 48 positions shown · non-contrast
Comparison: None.

CLINICAL DATA: 63-year-old male with left grip weakness, slurred
speech, confusion. Symptoms for 4 days. Initial encounter.

EXAM:
MRI HEAD WITHOUT CONTRAST
MRA HEAD WITHOUT CONTRAST
TECHNIQUE: Multiplanar, multiecho pulse sequences of the brain and surrounding
structures were obtained without intravenous contrast. Angiographic
images of the head were obtained using MRA technique without
contrast.

[Series 3: DWI · axial · 3.0mm · 1.09mm/px · z∈[-55,+88]mm · 8 of 98 slices shown (1 of 4)]
[im 1/98]
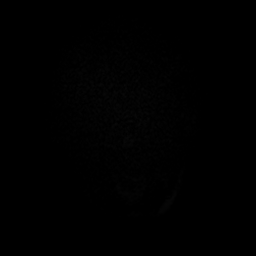
[im 14/98]
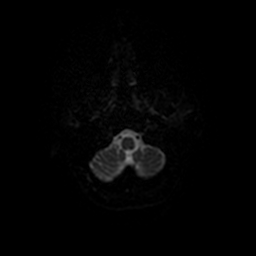
[im 28/98]
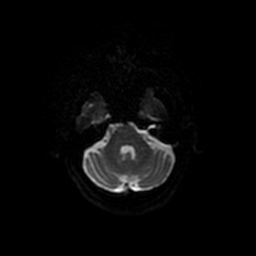
[im 42/98]
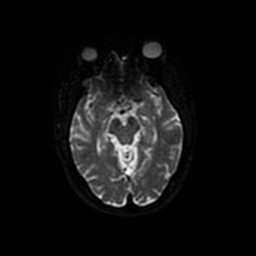
[im 56/98]
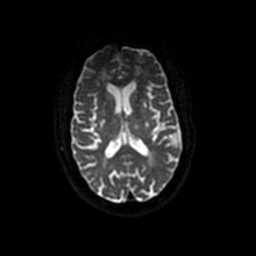
[im 70/98]
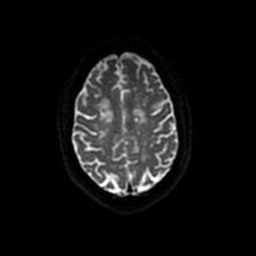
[im 84/98]
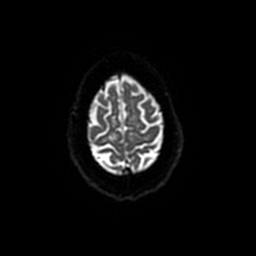
[im 98/98]
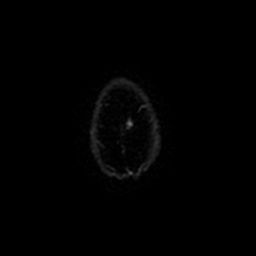

[Series 4: T1 · sagittal · 5.0mm · 0.47mm/px · 2 of 24 slices shown]
[im 1/24]
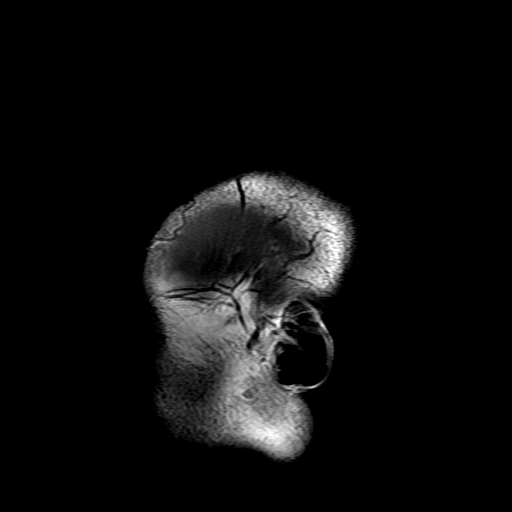
[im 24/24]
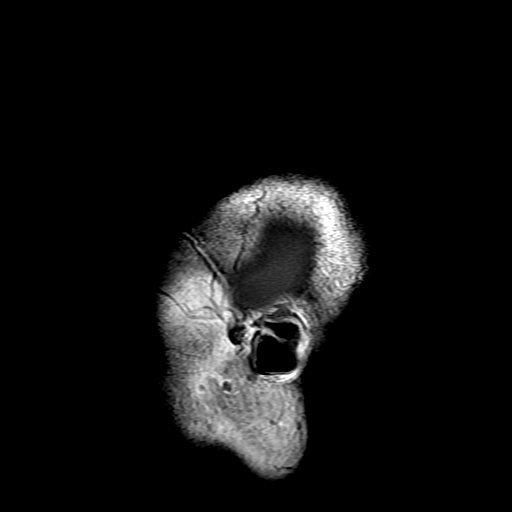

[Series 5: DWI · coronal · 5.0mm · 1.09mm/px · 5 of 62 slices shown (2 of 4)]
[im 1/62]
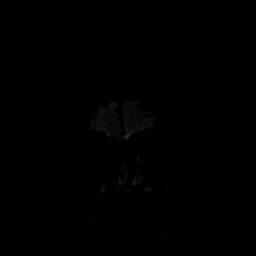
[im 16/62]
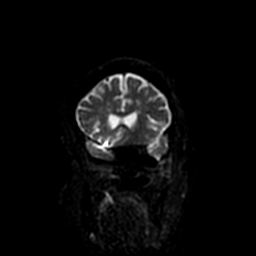
[im 31/62]
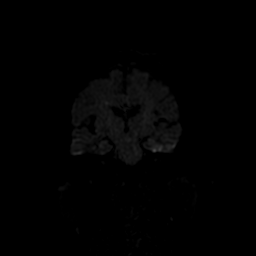
[im 46/62]
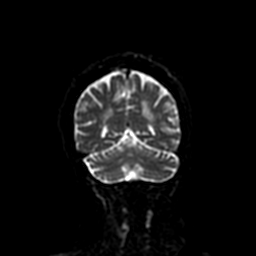
[im 62/62]
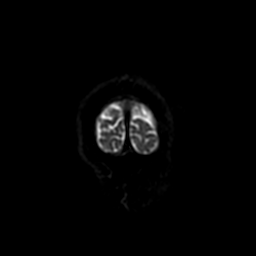

[Series 6: T2 · axial · 5.0mm · 0.43mm/px · z∈[-63,+84]mm · 2 of 24 slices shown (1 of 2)]
[im 1/24]
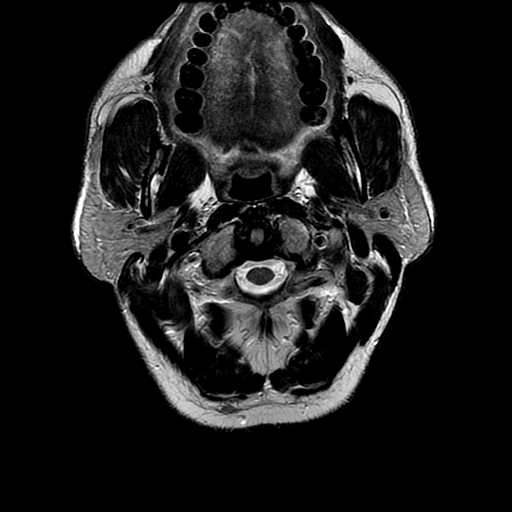
[im 24/24]
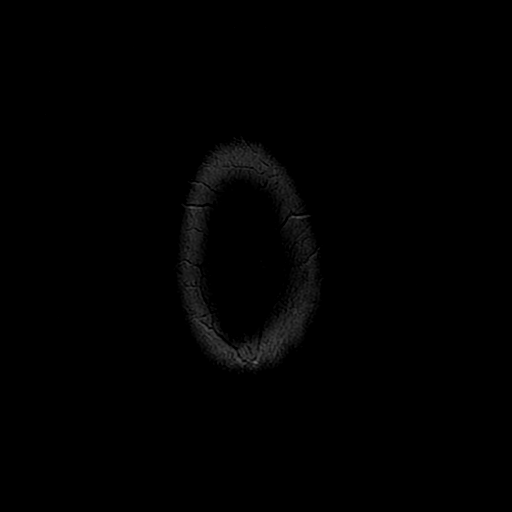

[Series 7: FLAIR · axial · 5.0mm · 0.43mm/px · z∈[-68,+89]mm · 2 of 24 slices shown]
[im 1/24]
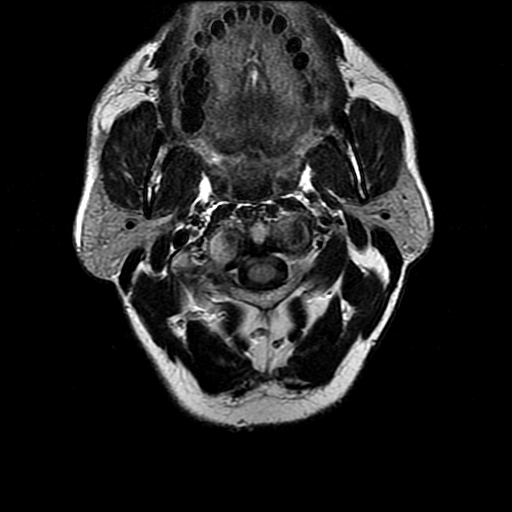
[im 24/24]
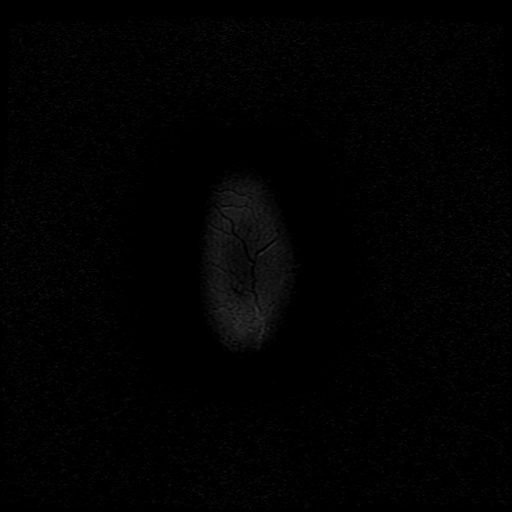

[Series 8: ax mpgr · axial · 5.0mm · 0.47mm/px · z∈[-70,+87]mm · 2 of 24 slices shown]
[im 1/24]
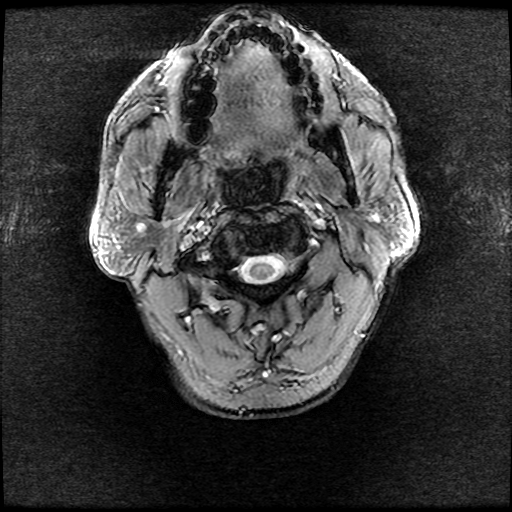
[im 24/24]
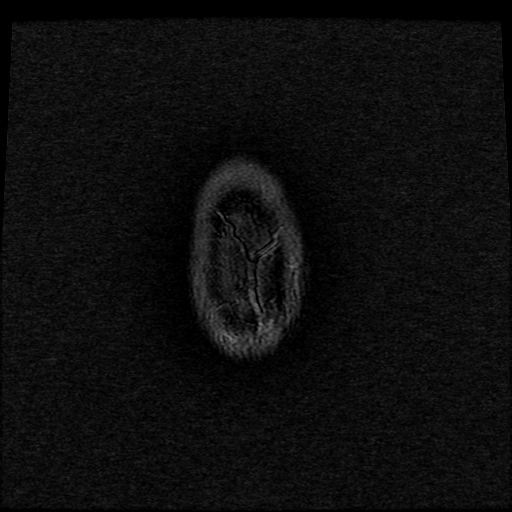

[Series 9: ax fspgr irp · axial · 3.0mm · 0.47mm/px · z∈[-73,-32]mm · 2 of 60 slices shown]
[im 1/60]
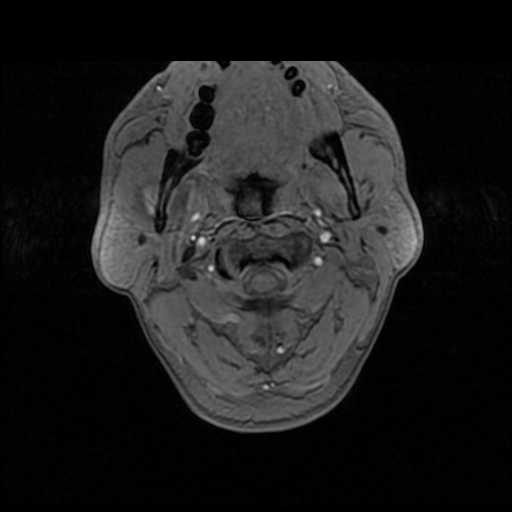
[im 15/60]
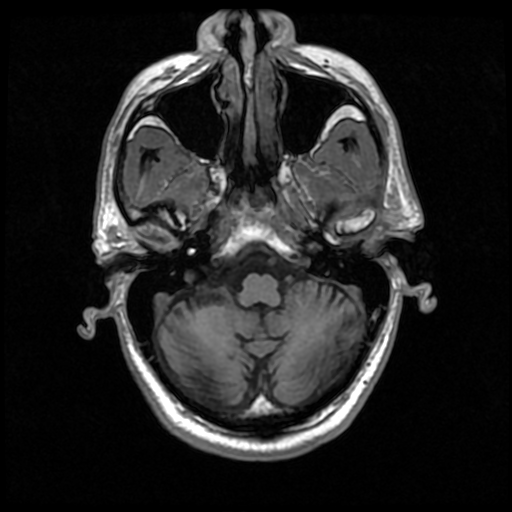

[Series 10: T2 · coronal · 5.0mm · 0.47mm/px · 2 of 24 slices shown (2 of 2)]
[im 1/24]
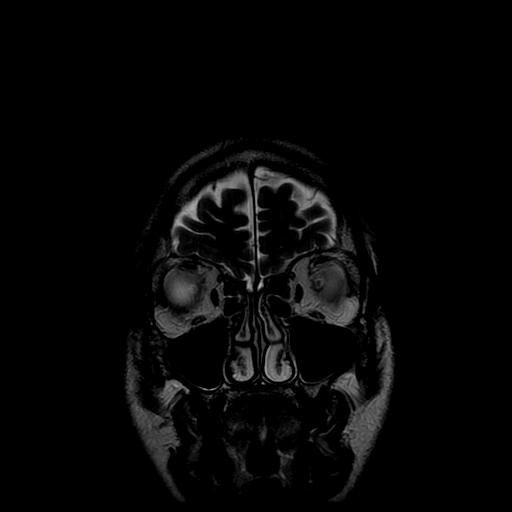
[im 24/24]
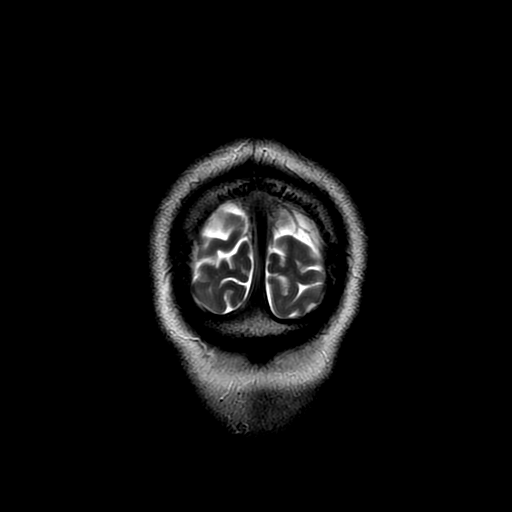

[Series 300: DWI · axial · 3.0mm · 1.09mm/px · z∈[-55,+88]mm · 4 of 46 slices shown (3 of 4)]
[im 1/46]
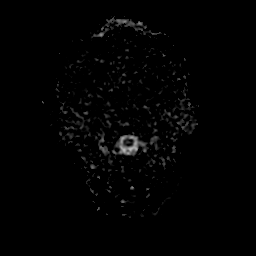
[im 16/46]
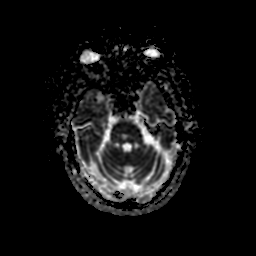
[im 31/46]
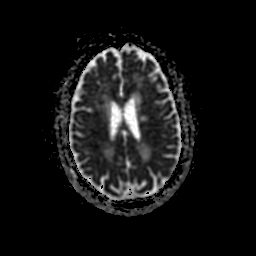
[im 46/46]
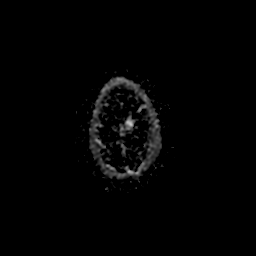

[Series 500: DWI · coronal · 5.0mm · 1.09mm/px · 3 of 31 slices shown (4 of 4)]
[im 1/31]
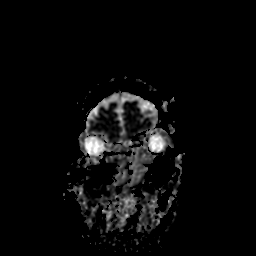
[im 16/31]
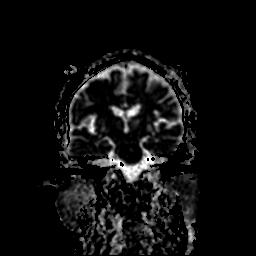
[im 31/31]
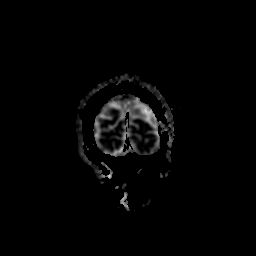

[32 of 48 positions shown; findings below may reference images not displayed]

FINDINGS: MRI HEAD FINDINGS

14 mm area of restricted diffusion in the right corona
radiata/centrum semiovale (series 5, image 15) with T2 and FLAIR
hyperintensity. There is associated T1 hypointensity. No associated
mass effect or malignant hemorrhagic transformation.

No other restricted diffusion. Major intracranial vascular flow
voids are preserved.

Numerous chronic lacunar infarcts elsewhere in the brain including
multifocal involvement in the pons (series 6, images 8 and 9),
bilateral thalami (images 13 and 14), bilateral cerebral white
matter, and also the right cerebellum. Scattered chronic micro
hemorrhages in the brain, more numerous in the left hemisphere. No
prior large vessel infarct identified.

No midline shift, mass effect, evidence of mass lesion,
ventriculomegaly, or extra-axial collection. Cervicomedullary
junction and pituitary are within normal limits. Negative visualized
cervical spine. Visible internal auditory structures appear normal.
Mastoids are clear. Trace paranasal sinus mucosal thickening.
Negative scalp soft tissues. Normal bone marrow signal.

MRA HEAD FINDINGS

Antegrade flow in the posterior circulation with mildly dominant
distal left vertebral artery. No distal vertebral artery stenosis.
Patent PICA origins. Patent basilar artery with mild irregularity
but no stenosis. Normal SCA and PCA origins. Posterior communicating
arteries are diminutive or absent. Bilateral PCA branches are within
normal limits.

Antegrade flow in both ICA siphons. Mildly tortuous distal cervical
left ICA. No siphon stenosis. Normal ophthalmic artery origins.
Patent carotid termini. Normal MCA and ACA origins. Anterior
communicating artery and visualized ACA branches are within normal
limits. Left MCA M1 segment and bifurcation are patent. Visualized
left MCA branches are within normal limits.

Right MCA M1 segment and bifurcation are patent without stenosis.
Visualized right MCA branches are within normal limits.
IMPRESSION: 1. Acute to subacute right MCA white matter infarct. Underlying
advanced chronic small vessel disease including chronic bilateral
cerebral white matter, thalami, and brainstem lacunar infarcts, as
well as scattered micro-hemorrhages.
2. Intracranial MRA is essentially negative, without large or medium
size vessel occlusion or stenosis.
Preliminary report of the above discussed by telephone with Dr. BRAHAJ
MAZEAU on 01/28/2015 at 6545 hours.
